# Patient Record
Sex: Female | Born: 1935 | Race: Black or African American | Hispanic: No | Marital: Married | Smoking: Never smoker
Health system: Southern US, Community
[De-identification: ages and names within clinical notes are randomized; demographics above are authoritative.]

## PROBLEM LIST (undated history)

## (undated) DIAGNOSIS — F0391 Unspecified dementia with behavioral disturbance: Secondary | ICD-10-CM

## (undated) DIAGNOSIS — F1020 Alcohol dependence, uncomplicated: Secondary | ICD-10-CM

## (undated) DIAGNOSIS — K219 Gastro-esophageal reflux disease without esophagitis: Secondary | ICD-10-CM

## (undated) DIAGNOSIS — I499 Cardiac arrhythmia, unspecified: Secondary | ICD-10-CM

## (undated) DIAGNOSIS — M1990 Unspecified osteoarthritis, unspecified site: Secondary | ICD-10-CM

## (undated) DIAGNOSIS — D649 Anemia, unspecified: Secondary | ICD-10-CM

## (undated) DIAGNOSIS — Z800 Family history of malignant neoplasm of digestive organs: Secondary | ICD-10-CM

## (undated) DIAGNOSIS — I10 Essential (primary) hypertension: Secondary | ICD-10-CM

## (undated) HISTORY — DX: Alcohol dependence, uncomplicated: F10.20

## (undated) HISTORY — DX: Unspecified dementia with behavioral disturbance: F03.91

## (undated) HISTORY — PX: CHOLECYSTECTOMY: SHX55

## (undated) HISTORY — DX: Cardiac arrhythmia, unspecified: I49.9

## (undated) HISTORY — DX: Gastro-esophageal reflux disease without esophagitis: K21.9

## (undated) HISTORY — DX: Family history of malignant neoplasm of digestive organs: Z80.0

## (undated) HISTORY — DX: Essential (primary) hypertension: I10

## (undated) HISTORY — PX: APPENDECTOMY: SHX54

## (undated) HISTORY — DX: Unspecified osteoarthritis, unspecified site: M19.90

## (undated) HISTORY — DX: Anemia, unspecified: D64.9

---

## 1999-02-14 ENCOUNTER — Ambulatory Visit (HOSPITAL_COMMUNITY): Admission: RE | Admit: 1999-02-14 | Discharge: 1999-02-14 | Payer: Self-pay | Admitting: Internal Medicine

## 2000-03-15 ENCOUNTER — Encounter: Admission: RE | Admit: 2000-03-15 | Discharge: 2000-03-15 | Payer: Self-pay | Admitting: Internal Medicine

## 2000-03-15 ENCOUNTER — Encounter: Payer: Self-pay | Admitting: Internal Medicine

## 2001-03-17 ENCOUNTER — Encounter: Admission: RE | Admit: 2001-03-17 | Discharge: 2001-03-17 | Payer: Self-pay | Admitting: Internal Medicine

## 2001-03-17 ENCOUNTER — Encounter: Payer: Self-pay | Admitting: Internal Medicine

## 2002-03-21 ENCOUNTER — Encounter: Payer: Self-pay | Admitting: Internal Medicine

## 2002-03-21 ENCOUNTER — Encounter: Admission: RE | Admit: 2002-03-21 | Discharge: 2002-03-21 | Payer: Self-pay | Admitting: Internal Medicine

## 2003-06-04 ENCOUNTER — Other Ambulatory Visit: Admission: RE | Admit: 2003-06-04 | Discharge: 2003-06-04 | Payer: Self-pay | Admitting: Internal Medicine

## 2003-08-06 ENCOUNTER — Encounter: Admission: RE | Admit: 2003-08-06 | Discharge: 2003-08-06 | Payer: Self-pay | Admitting: Internal Medicine

## 2003-08-10 ENCOUNTER — Encounter: Admission: RE | Admit: 2003-08-10 | Discharge: 2003-08-10 | Payer: Self-pay | Admitting: Internal Medicine

## 2004-05-06 ENCOUNTER — Encounter: Admission: RE | Admit: 2004-05-06 | Discharge: 2004-05-06 | Payer: Self-pay | Admitting: Internal Medicine

## 2005-02-04 IMAGING — CT CT ABDOMEN WO/W CM
1 of 2 series · 15 of 32 positions shown, 19 images · IV contrast (GASTROGRAFIN)
Comparison: none

CLINICAL DATA: Follow up left renal mass on ultrasound 08/06/03.  Con none.   Post cholecystectomy, hysterectomy, appendectomy, liver laceration repair 4134.  Hematuria. 
CT ABDOMEN W/O AND W/CONTRAST: 
Comparison is made with abdominal ultrasound 08/06/03.  Following oral contrast and pre and post IV administration 399cc of Omnipaque 300 multidetector spiral axial images were obtained through the abdomen.   In the region of suspicious 3cm focal bulging at the mid left kidney is CT evidence for anatomic variant so called dromedary hump.  No focal solid nor cystic mass is seen in this region by CT.  No abnormal calcifications are seen.  Tiny 2mm lower pole right renal cystic cortical foci are seen.  The bilateral kidneys are otherwise unremarkable.  Slight cardiomegaly with clear lung bases are noted.  Patient is post cholecystectomy with resultant slight prominence of the biliary tract.  No normal spleen is seen with slight areas of residual splenic tissue/splenosis.  Remaining abdominal organs appear normal without adenopathy nor inflammation.

[Series 3: — · axial · 0.78mm/px · z∈[-261,-26]mm · 15 of 119 slices shown, 19 images]
[im 6/119  soft-tissue]
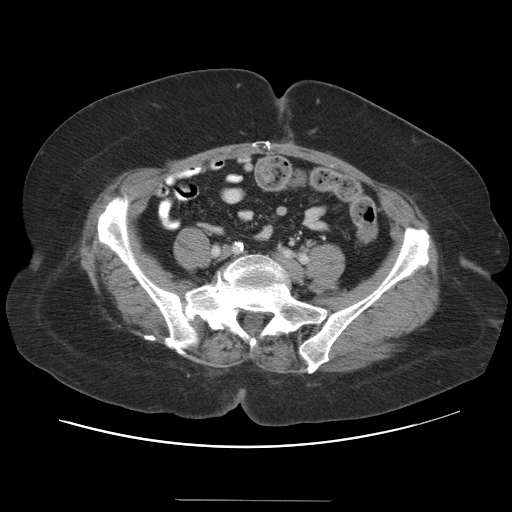
[im 6/119  bone]
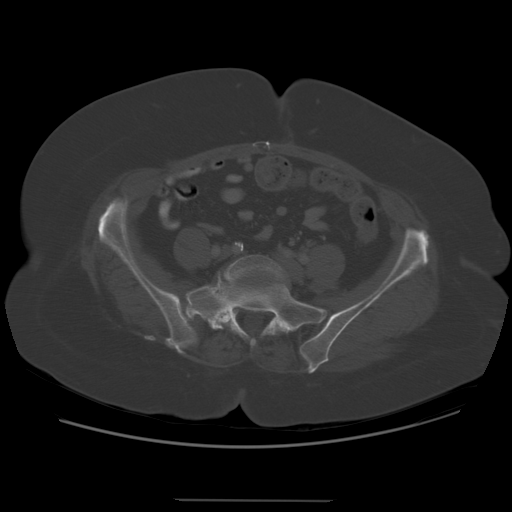
[im 17/119  soft-tissue]
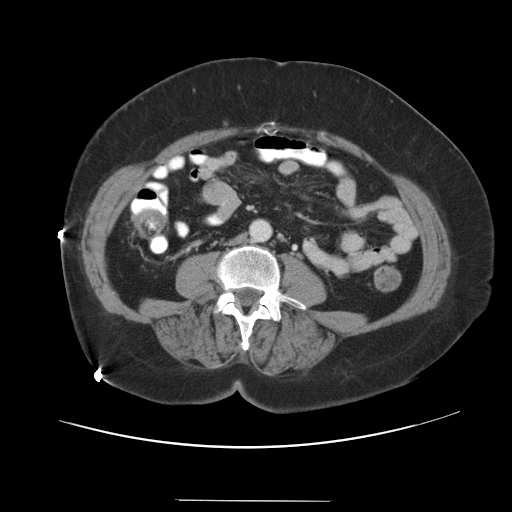
[im 27/119  soft-tissue]
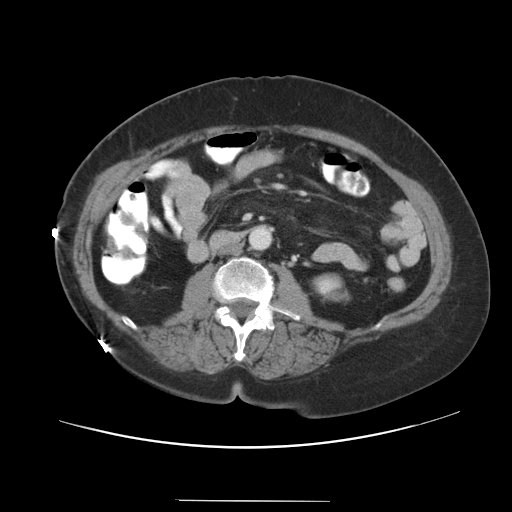
[im 33/119  soft-tissue]
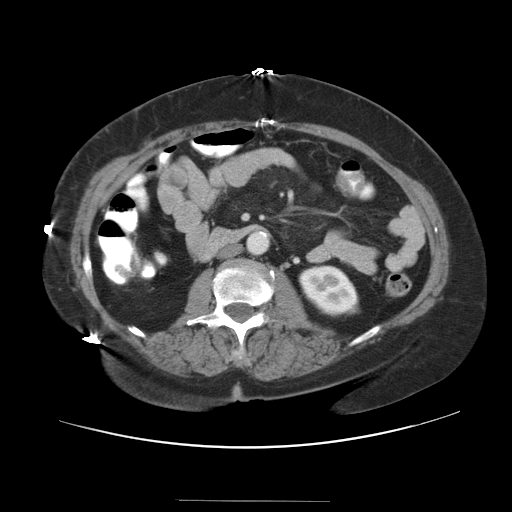
[im 43/119  soft-tissue]
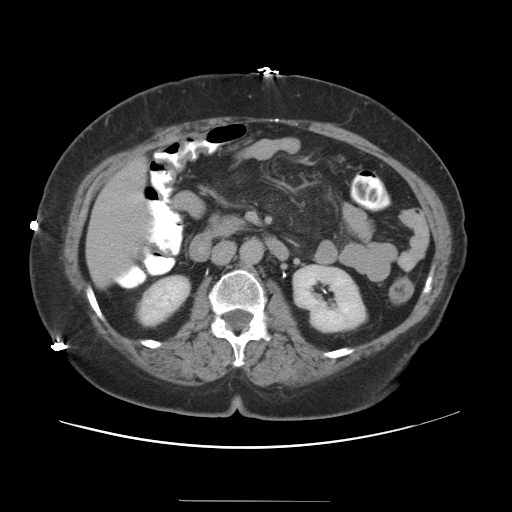
[im 49/119  soft-tissue]
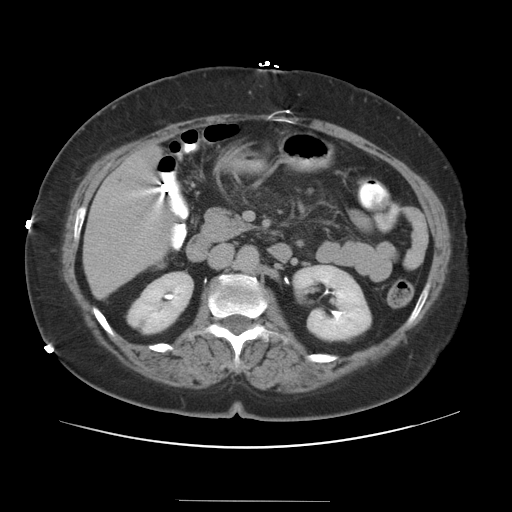
[im 60/119  soft-tissue]
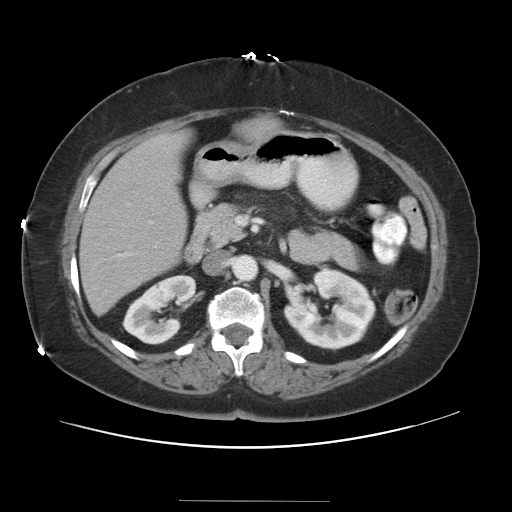
[im 70/119  soft-tissue]
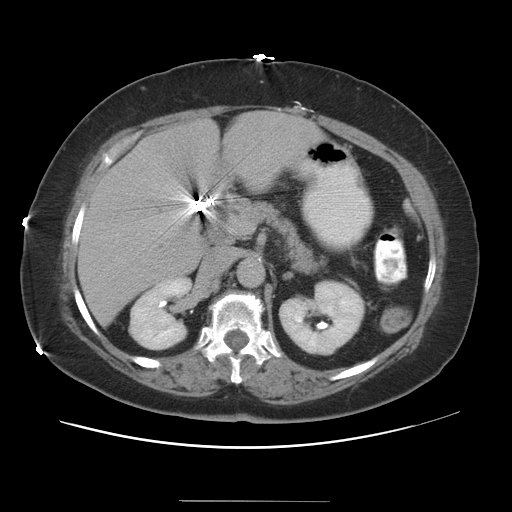
[im 76/119  soft-tissue]
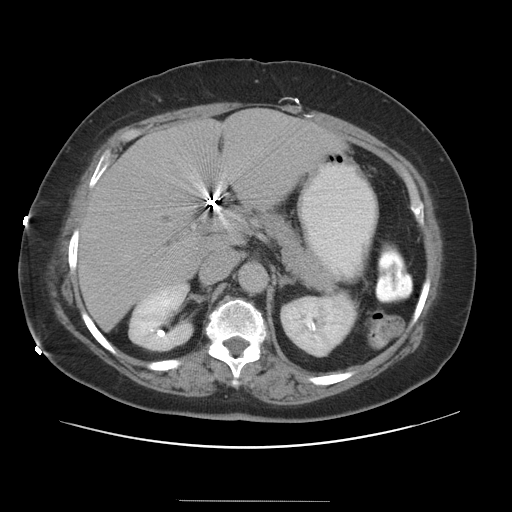
[im 76/119  bone]
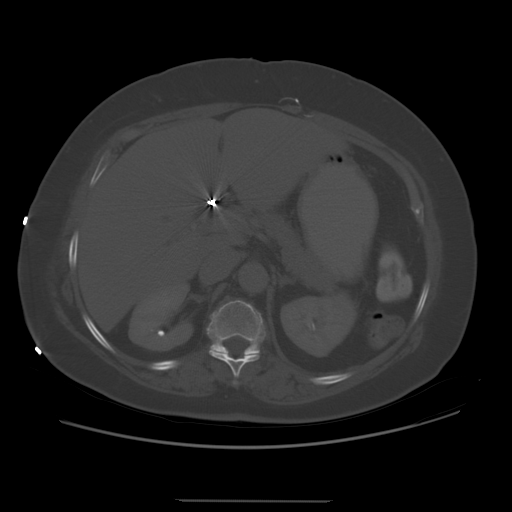
[im 86/119  soft-tissue]
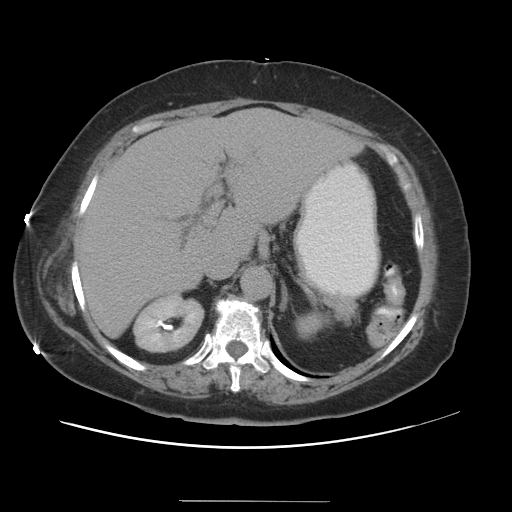
[im 92/119  soft-tissue]
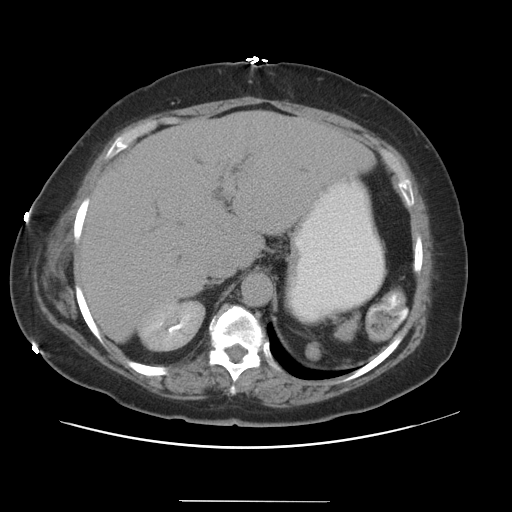
[im 97/119  lung]
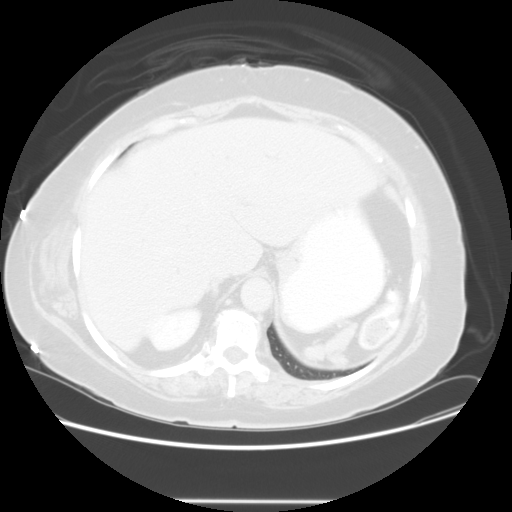
[im 102/119  soft-tissue]
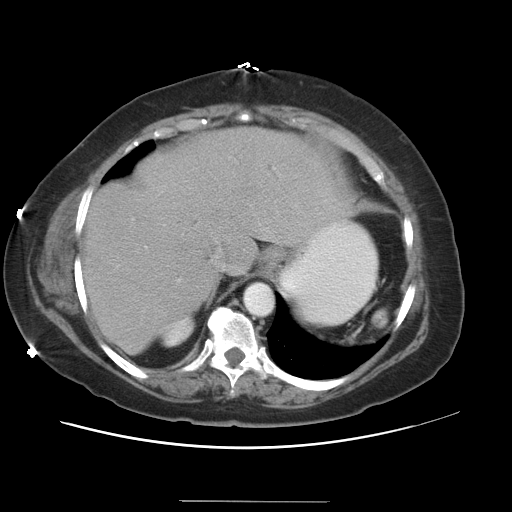
[im 102/119  lung]
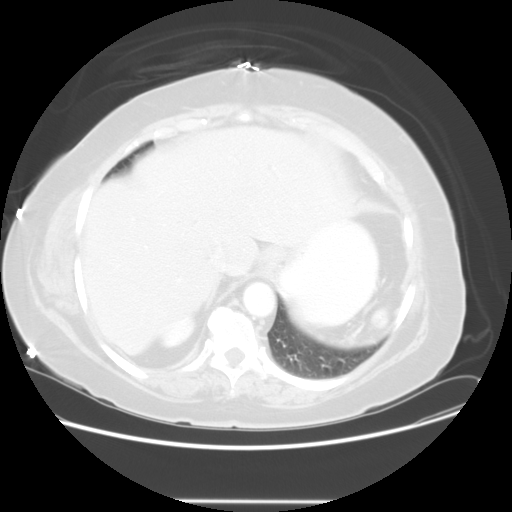
[im 108/119  lung]
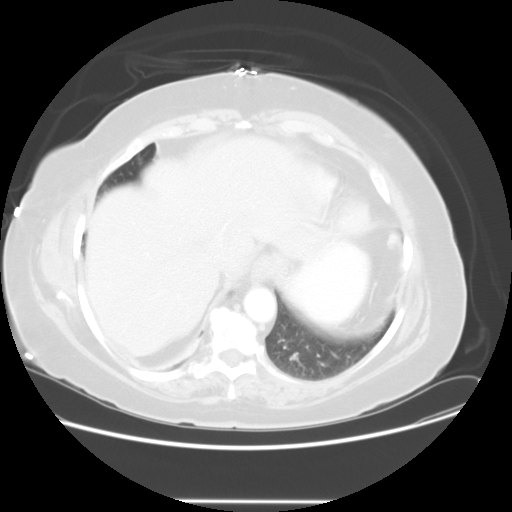
[im 113/119  soft-tissue]
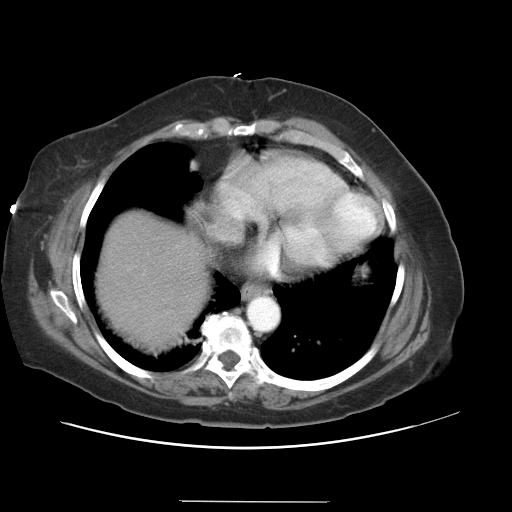
[im 113/119  lung]
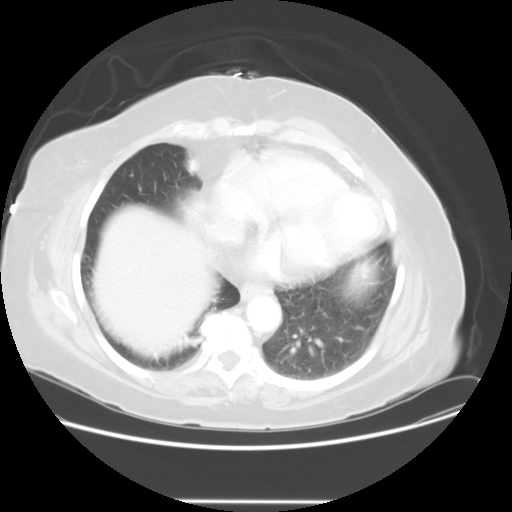

[15 of 32 positions shown; findings below may reference images not displayed]

IMPRESSION: Since abdominal ultrasound 08/06/03: 
1.  Ultrasound finding consistent with normal anatomic variant left mid renal dromedary hump/fetal lobulation. No renal mass seen.
2.  Slight splenosis (question prior splenectomy). 
3.  Post cholecystectomy with resultant slight prominence of the biliary tract. 
4.  Incidental tiny lower pole right renal cystic foci and cardiomegaly.
5.  Otherwise negative.

## 2006-03-19 ENCOUNTER — Encounter: Admission: RE | Admit: 2006-03-19 | Discharge: 2006-03-19 | Payer: Self-pay | Admitting: Internal Medicine

## 2008-06-05 ENCOUNTER — Encounter: Admission: RE | Admit: 2008-06-05 | Discharge: 2008-06-05 | Payer: Self-pay | Admitting: Internal Medicine

## 2010-06-18 ENCOUNTER — Other Ambulatory Visit: Payer: Self-pay | Admitting: Internal Medicine

## 2010-06-18 DIAGNOSIS — Z1231 Encounter for screening mammogram for malignant neoplasm of breast: Secondary | ICD-10-CM

## 2010-06-25 ENCOUNTER — Ambulatory Visit
Admission: RE | Admit: 2010-06-25 | Discharge: 2010-06-25 | Disposition: A | Discharge status: Home / Self Care | Payer: PRIVATE HEALTH INSURANCE | Source: Ambulatory Visit | Attending: Internal Medicine | Admitting: Internal Medicine

## 2010-06-25 DIAGNOSIS — Z1231 Encounter for screening mammogram for malignant neoplasm of breast: Secondary | ICD-10-CM

## 2011-09-04 ENCOUNTER — Other Ambulatory Visit: Payer: Self-pay | Admitting: Internal Medicine

## 2011-09-04 DIAGNOSIS — D649 Anemia, unspecified: Secondary | ICD-10-CM

## 2011-09-04 DIAGNOSIS — R634 Abnormal weight loss: Secondary | ICD-10-CM

## 2011-09-08 ENCOUNTER — Ambulatory Visit
Admission: RE | Admit: 2011-09-08 | Discharge: 2011-09-08 | Disposition: A | Discharge status: Home / Self Care | Payer: Medicare Other | Source: Ambulatory Visit | Attending: Internal Medicine | Admitting: Internal Medicine

## 2011-09-08 DIAGNOSIS — D649 Anemia, unspecified: Secondary | ICD-10-CM

## 2011-09-08 DIAGNOSIS — R634 Abnormal weight loss: Secondary | ICD-10-CM

## 2011-09-08 MED ORDER — IOHEXOL 300 MG/ML  SOLN
100.0000 mL | Freq: Once | INTRAMUSCULAR | Status: AC | PRN
  Administered 2011-09-08: 100 mL via INTRAVENOUS

## 2011-09-11 ENCOUNTER — Encounter: Payer: Self-pay | Admitting: Gastroenterology

## 2011-10-16 ENCOUNTER — Other Ambulatory Visit: Payer: Self-pay | Admitting: Internal Medicine

## 2011-10-16 ENCOUNTER — Ambulatory Visit
Admission: RE | Admit: 2011-10-16 | Discharge: 2011-10-16 | Disposition: A | Discharge status: Home / Self Care | Payer: Medicare Other | Source: Ambulatory Visit | Attending: Internal Medicine | Admitting: Internal Medicine

## 2011-10-16 DIAGNOSIS — R634 Abnormal weight loss: Secondary | ICD-10-CM

## 2011-10-21 ENCOUNTER — Encounter: Payer: Self-pay | Admitting: Gastroenterology

## 2011-10-21 ENCOUNTER — Other Ambulatory Visit (INDEPENDENT_AMBULATORY_CARE_PROVIDER_SITE_OTHER): Payer: Medicare Other

## 2011-10-21 ENCOUNTER — Ambulatory Visit (INDEPENDENT_AMBULATORY_CARE_PROVIDER_SITE_OTHER): Payer: Medicare Other | Admitting: Gastroenterology

## 2011-10-21 VITALS — BP 132/68 | HR 74 | Ht 62.0 in | Wt 161.0 lb

## 2011-10-21 DIAGNOSIS — R634 Abnormal weight loss: Secondary | ICD-10-CM

## 2011-10-21 DIAGNOSIS — D649 Anemia, unspecified: Secondary | ICD-10-CM

## 2011-10-21 LAB — COMPREHENSIVE METABOLIC PANEL
BUN: 23 mg/dL (ref 6–23)
CO2: 26 mEq/L (ref 19–32)
Calcium: 9.2 mg/dL (ref 8.4–10.5)
Chloride: 102 mEq/L (ref 96–112)
Creatinine, Ser: 1 mg/dL (ref 0.4–1.2)
GFR: 68.82 mL/min (ref >60.00)
Glucose, Bld: 92 mg/dL (ref 70–99)

## 2011-10-21 LAB — CBC WITH DIFFERENTIAL/PLATELET
Basophils Relative: 0.5 % (ref 0.0–3.0)
Eosinophils Absolute: 0.1 10*3/uL (ref 0.0–0.7)
Hemoglobin: 10.2 g/dL — ABNORMAL LOW (ref 12.0–15.0)
Lymphocytes Relative: 37.7 % (ref 12.0–46.0)
MCHC: 32.1 g/dL (ref 30.0–36.0)
Neutro Abs: 3.9 10*3/uL (ref 1.4–7.7)
RBC: 3.35 Mil/uL — ABNORMAL LOW (ref 3.87–5.11)

## 2011-10-21 LAB — T4: T4, Total: 7.6 ug/dL (ref 5.0–12.5)

## 2011-10-21 MED ORDER — PEG-KCL-NACL-NASULF-NA ASC-C 100 G PO SOLR: 1.0000 | Freq: Once | ORAL | Status: DC

## 2011-10-21 NOTE — Progress Notes (Signed)
History of Present Illness: [name] s a pleasant 78 year old Afro-American female referred at the request of Dr. Chilton Si for evaluation of weight loss. Over the past year she's lost about 30 pounds. She attributes this to poor appetite. She has no specific GI complaints his except for mild pyrosis for which she takes omeprazole with relief. There's been no change in her bowel habits, abdominal pain, melena or hematochezia. She has been noted to be anemic with equivocal iron studies. Last colonoscopy was in 10/18/02 which was normal. Mother had colon cancer at 64. There has been no change in her medications. She does complain of mild fatigue. There are no masses. Stool is Hemoccult negative.  Reason CT scan did not show any intra-abdominal abnormalities. Air was noted in the vagina.    Past Medical History  Diagnosis Date  . Hypertension   . Arrhythmia   . Family hx of colon cancer     mother  . GERD (gastroesophageal reflux disease)   . Alcoholism   . Anemia     Iron def  . Arthritis    Past Surgical History  Procedure Date  . Appendectomy   . Cholecystectomy    family history includes Colon cancer (age of onset:80) in her mother and Lung cancer in her father. Current Outpatient Prescriptions  Medication Sig Dispense Refill  . atenolol-chlorthalidone (TENORETIC) 50-25 MG per tablet Take 1 tablet by mouth daily.      Tery Sanfilippo Calcium (STOOL SOFTENER PO) Take by mouth as needed.      . Multiple Vitamin (MULTIVITAMIN) capsule Take 1 capsule by mouth daily.      Marland Kitchen omeprazole (PRILOSEC) 20 MG capsule Take 20 mg by mouth daily.      . traMADol-acetaminophen (ULTRACET) 37.5-325 MG per tablet Take 1 tablet by mouth every 6 (six) hours as needed.       Allergies as of 10/21/2011 - Review Complete 10/21/2011  Allergen Reaction Noted  . Codeine  10/20/2011  . Latex  10/20/2011    reports that she has never smoked. She has never used smokeless tobacco. She reports that she drinks alcohol. She  reports that she does not use illicit drugs.     Review of Systems: Pertinent positive and negative review of systems were noted in the above HPI section. All other review of systems were otherwise negative.  Vital signs were reviewed in today's medical record Physical Exam: General: Well developed , well nourished, no acute distress Head: Normocephalic and atraumatic Eyes:  sclerae anicteric, EOMI Ears: Normal auditory acuity Mouth: No deformity or lesions Neck: Supple, no masses or thyromegaly Lungs: Clear throughout to auscultation Heart: Regular rate and rhythm; no murmurs, rubs or bruits Abdomen: Soft, non tender and non distended. No masses, hepatosplenomegaly or hernias noted. Normal Bowel sounds Rectal:deferred Musculoskeletal: Symmetrical with no gross deformities  Skin: No lesions on visible extremities Pulses:  Normal pulses noted Extremities: No clubbing, cyanosis, edema or deformities noted Neurological: Alert oriented x 4, grossly nonfocal Cervical Nodes:  No significant cervical adenopathy Inguinal Nodes: No significant inguinal adenopathy Psychological:  Alert and cooperative. Normal mood and affect

## 2011-10-21 NOTE — Patient Instructions (Addendum)
Colonoscopy A colonoscopy is an exam to evaluate your entire colon. In this exam, your colon is cleansed. A long fiberoptic tube is inserted through your rectum and into your colon. The fiberoptic scope (endoscope) is a long bundle of enclosed and very flexible fibers. These fibers transmit light to the area examined and send images from that area to your caregiver. Discomfort is usually minimal. You may be given a drug to help you sleep (sedative) during or prior to the procedure. This exam helps to detect lumps (tumors), polyps, inflammation, and areas of bleeding. Your caregiver may also take a small piece of tissue (biopsy) that will be examined under a microscope. LET YOUR CAREGIVER KNOW ABOUT:   Allergies to food or medicine.   Medicines taken, including vitamins, herbs, eyedrops, over-the-counter medicines, and creams.   Use of steroids (by mouth or creams).   Previous problems with anesthetics or numbing medicines.   History of bleeding problems or blood clots.   Previous surgery.   Other health problems, including diabetes and kidney problems.   Possibility of pregnancy, if this applies.  BEFORE THE PROCEDURE   A clear liquid diet may be required for 2 days before the exam.   Ask your caregiver about changing or stopping your regular medications.   Liquid injections (enemas) or laxatives may be required.   A large amount of electrolyte solution may be given to you to drink over a short period of time. This solution is used to clean out your colon.   You should be present 60 minutes prior to your procedure or as directed by your caregiver.  AFTER THE PROCEDURE   If you received a sedative or pain relieving medication, you will need to arrange for someone to drive you home.   Occasionally, there is a little blood passed with the first bowel movement. Do not be concerned.  FINDING OUT THE RESULTS OF YOUR TEST Not all test results are available during your visit. If your test  results are not back during the visit, make an appointment with your caregiver to find out the results. Do not assume everything is normal if you have not heard from your caregiver or the medical facility. It is important for you to follow up on all of your test results. HOME CARE INSTRUCTIONS   It is not unusual to pass moderate amounts of gas and experience mild abdominal cramping following the procedure. This is due to air being used to inflate your colon during the exam. Walking or a warm pack on your belly (abdomen) may help.   You may resume all normal meals and activities after sedatives and medicines have worn off.   Only take over-the-counter or prescription medicines for pain, discomfort, or fever as directed by your caregiver. Do not use aspirin or blood thinners if a biopsy was taken. Consult your caregiver for medicine usage if biopsies were taken.  SEEK IMMEDIATE MEDICAL CARE IF:   You have a fever.   You pass large blood clots or fill a toilet with blood following the procedure. This may also occur 10 to 14 days following the procedure. This is more likely if a biopsy was taken.   You develop abdominal pain that keeps getting worse and cannot be relieved with medicine.  Document Released: 04/24/2000 Document Revised: 04/16/2011 Document Reviewed: 12/08/2007 [employer] Patient Information 2012 [employer]. 

## 2011-10-21 NOTE — Assessment & Plan Note (Addendum)
Etiology for anorexia and weight loss is not certain. There is no evidence for hyperthyroidism. Occult malignancy is a concern although there were no obvious abnormalities on the CT scan. Air was noted in the vagina which raises the question of a fistula. She also has a history of anemia although her blood counts are not available.  Recommendations #1 check CBC, TSH CMET #2 colonoscopy; if not diagnostic I will proceed with upper endoscopy

## 2011-10-23 ENCOUNTER — Encounter: Payer: Self-pay | Admitting: Gastroenterology

## 2011-10-23 ENCOUNTER — Ambulatory Visit (AMBULATORY_SURGERY_CENTER): Payer: Medicare Other | Admitting: Gastroenterology

## 2011-10-23 VITALS — BP 139/90 | HR 59 | Temp 98.4°F | Resp 17 | Ht 62.0 in | Wt 161.0 lb

## 2011-10-23 DIAGNOSIS — R634 Abnormal weight loss: Secondary | ICD-10-CM

## 2011-10-23 DIAGNOSIS — D649 Anemia, unspecified: Secondary | ICD-10-CM

## 2011-10-23 DIAGNOSIS — K5730 Diverticulosis of large intestine without perforation or abscess without bleeding: Secondary | ICD-10-CM

## 2011-10-23 DIAGNOSIS — D126 Benign neoplasm of colon, unspecified: Secondary | ICD-10-CM

## 2011-10-23 MED ORDER — SODIUM CHLORIDE 0.9 % IV SOLN: 500.0000 mL | INTRAVENOUS | Status: DC

## 2011-10-23 NOTE — Op Note (Signed)
[facility] [address] [place], [place]  [address]  COLONOSCOPY PROCEDURE REPORT  PATIENT:  Dana Delgado, Dana Delgado  MR#:  [id number] BIRTHDATE:  March 14, 1931, 80 yrs. old  GENDER:  female ENDOSCOPIST:  Barbette Hair. Arlyce Dice, MD REF. BY:  Nila Nephew, M.D. PROCEDURE DATE:  10/23/2011 PROCEDURE:  Colonoscopy with snare polypectomy, Colon with cold biopsy polypectomy ASA CLASS:  Class II INDICATIONS:  weight loss MEDICATIONS:   MAC sedation, administered by CRNA propofol 300mg IV  DESCRIPTION OF PROCEDURE:   After the risks benefits and alternatives of the procedure were thoroughly explained, informed consent was obtained.  Digital rectal exam was performed and revealed no abnormalities.   The LB CF-H180AL [id number] endoscope was introduced through the anus and advanced to the cecum, which was identified by both the appendix and ileocecal valve, without limitations.  The quality of the prep was excellent, using MoviPrep.  The instrument was then slowly withdrawn as the colon was fully examined. <<PROCEDUREIMAGES>>  FINDINGS:  A sessile polyp was found in the ascending colon. It was 10 mm in size. Polyps were snared without cautery. Retrieval was successful (see image8). snare polyp  There were multiple polyps identified and removed. in the ascending colon. 3 1-38mm sessile polyps - removed with cold biopsy forceps, submitted to pathology.  A sessile polyp was found in the cecum. It was 3 mm in size. Polyp was snared without cautery. Retrieval was successful (see image2). snare polyp  Mild diverticulosis was found in the sigmoid colon (see image10).  This was otherwise a normal examination of the colon (see image1 and image11).   Retroflexed views in the rectum revealed no abnormalities.    The time to cecum =  1) 2.75  minutes. The scope was then withdrawn in  1) 26.0  minutes from the cecum and the procedure completed. COMPLICATIONS:  None ENDOSCOPIC IMPRESSION: 1) 10 mm sessile  polyp in the ascending colon 2) Polyps, multiple in the ascending colon 3) 3 mm sessile polyp in the cecum 4) Mild diverticulosis in the sigmoid colon 5) Otherwise normal examination  Multiple benign appearing polyps.  Findings do not explain weight loss.  RECOMMENDATIONS: 1) Upper endoscopy will be scheduled  REPEAT EXAM:  No (age)  ______________________________ Barbette Hair. Arlyce Dice, MD  CC:  n. eSIGNED:   Barbette Hair. Nefertiti Mohamad at 10/23/2011 10:32 AM  [name], [id number]

## 2011-10-23 NOTE — Progress Notes (Signed)
Patient did not experience any of the following events: a burn prior to discharge; a fall within the facility; wrong site/side/patient/procedure/implant event; or a hospital transfer or hospital admission upon discharge from the facility. (G8907) Patient did not have preoperative order for IV antibiotic SSI prophylaxis. (G8918)  

## 2011-10-23 NOTE — Patient Instructions (Signed)
Mild diverticulosis, multiple polyps removed and sent to pathology.  YOU HAD AN ENDOSCOPIC PROCEDURE TODAY AT THE [facility]: Refer to the procedure report that was given to you for any specific questions about what was found during the examination.  If the procedure report does not answer your questions, please call your gastroenterologist to clarify.  If you requested that your care partner not be given the details of your procedure findings, then the procedure report has been included in a sealed envelope for you to review at your convenience later.  YOU SHOULD EXPECT: Some feelings of bloating in the abdomen. Passage of more gas than usual.  Walking can help get rid of the air that was put into your GI tract during the procedure and reduce the bloating. If you had a lower endoscopy (such as a colonoscopy or flexible sigmoidoscopy) you may notice spotting of blood in your stool or on the toilet paper. If you underwent a bowel prep for your procedure, then you may not have a normal bowel movement for a few days.  DIET: Your first meal following the procedure should be a light meal and then it is ok to progress to your normal diet.  A half-sandwich or bowl of soup is an example of a good first meal.  Heavy or fried foods are harder to digest and may make you feel nauseous or bloated.  Likewise meals heavy in dairy and vegetables can cause extra gas to form and this can also increase the bloating.  Drink plenty of fluids but you should avoid alcoholic beverages for 24 hours.  ACTIVITY: Your care partner should take you home directly after the procedure.  You should plan to take it easy, moving slowly for the rest of the day.  You can resume normal activity the day after the procedure however you should NOT DRIVE or use heavy machinery for 24 hours (because of the sedation medicines used during the test).    SYMPTOMS TO REPORT IMMEDIATELY: A gastroenterologist can be reached at any hour.   During normal business hours, 8:30 AM to 5:00 PM Monday through Friday, call [phone].  After hours and on weekends, please call the GI answering service at [phone] who will take a message and have the physician on call contact you.   Following lower endoscopy (colonoscopy or flexible sigmoidoscopy):  Excessive amounts of blood in the stool  Significant tenderness or worsening of abdominal pains  Swelling of the abdomen that is new, acute  Fever of 100F or higher  FOLLOW UP: If any biopsies were taken you will be contacted by phone or by letter within the next 1-3 weeks.  Call your gastroenterologist if you have not heard about the biopsies in 3 weeks.  Our staff will call the home number listed on your records the next business day following your procedure to check on you and address any questions or concerns that you may have at that time regarding the information given to you following your procedure. This is a courtesy call and so if there is no answer at the home number and we have not heard from you through the emergency physician on call, we will assume that you have returned to your regular daily activities without incident.  SIGNATURES/CONFIDENTIALITY: You and/or your care partner have signed paperwork which will be entered into your electronic medical record.  These signatures attest to the fact that that the information above on your After Visit Summary has been reviewed and  is understood.  Full responsibility of the confidentiality of this discharge information lies with you and/or your care-partner.

## 2011-10-26 ENCOUNTER — Telehealth: Payer: Self-pay

## 2011-10-26 ENCOUNTER — Ambulatory Visit (AMBULATORY_SURGERY_CENTER): Payer: Medicare Other | Admitting: *Deleted

## 2011-10-26 VITALS — Ht 62.0 in | Wt 159.8 lb

## 2011-10-26 DIAGNOSIS — D649 Anemia, unspecified: Secondary | ICD-10-CM

## 2011-10-26 DIAGNOSIS — R634 Abnormal weight loss: Secondary | ICD-10-CM

## 2011-10-26 NOTE — Telephone Encounter (Signed)
Unable to leave message line busy times two. 

## 2011-10-28 ENCOUNTER — Encounter: Payer: Self-pay | Admitting: Gastroenterology

## 2011-10-29 ENCOUNTER — Ambulatory Visit (AMBULATORY_SURGERY_CENTER): Payer: Medicare Other | Admitting: Gastroenterology

## 2011-10-29 ENCOUNTER — Encounter: Payer: Self-pay | Admitting: Gastroenterology

## 2011-10-29 VITALS — BP 147/63 | HR 63 | Temp 98.2°F | Resp 18 | Ht 62.0 in | Wt 159.0 lb

## 2011-10-29 DIAGNOSIS — D649 Anemia, unspecified: Secondary | ICD-10-CM

## 2011-10-29 DIAGNOSIS — K2970 Gastritis, unspecified, without bleeding: Secondary | ICD-10-CM

## 2011-10-29 DIAGNOSIS — R634 Abnormal weight loss: Secondary | ICD-10-CM

## 2011-10-29 DIAGNOSIS — D131 Benign neoplasm of stomach: Secondary | ICD-10-CM

## 2011-10-29 MED ORDER — SODIUM CHLORIDE 0.9 % IV SOLN: 500.0000 mL | INTRAVENOUS | Status: DC

## 2011-10-29 NOTE — Op Note (Signed)
[facility] [address] [place], [place]  [address]  ENDOSCOPY PROCEDURE REPORT  PATIENT:  Dana Delgado, Dana Delgado  MR#:  [id number] BIRTHDATE:  08-Apr-1931, 80 yrs. old  GENDER:  female  ENDOSCOPIST:  Barbette Hair. Arlyce Dice, MD Referred by:  Nila Nephew, M.D.  PROCEDURE DATE:  10/29/2011 PROCEDURE:  EGD, diagnostic 43235 ASA CLASS:  Class II INDICATIONS:  weight loss  MEDICATIONS:   MAC sedation, administered by CRNA propofol 80mg IV TOPICAL ANESTHETIC:  DESCRIPTION OF PROCEDURE:   After the risks and benefits of the procedure were explained, informed consent was obtained.  The [facility] GIF-H180 [id number] endoscope was introduced through the mouth and advanced to the third portion of the duodenum.  The instrument was slowly withdrawn as the mucosa was fully examined. <<PROCEDUREIMAGES>>  Mild gastritis was found. Diffuse, mild edema in fundus with multiple polyps. Bxs taken (see image6).  Otherwise the examination was normal (see image1, image2, image3, image5, and image7).    Retroflexed views revealed no abnormalities.    The scope was then withdrawn from the patient and the procedure completed.  COMPLICATIONS:  None  ENDOSCOPIC IMPRESSION: 1) Mild gastritis 2) Otherwise normal examination  No clear etiology for weight loss RECOMMENDATIONS: 1) Await biopsy results 2) Call office next 2-3 days to schedule an office appointment for 4 weeks  ______________________________ Barbette Hair. Arlyce Dice, MD  CC:  n. eSIGNED:   Barbette Hair. Jaymz Traywick at 10/29/2011 09:28 AM  [name], [id number]

## 2011-10-29 NOTE — Patient Instructions (Addendum)
Discharge instructions given with verbal understanding. Handout on gastritis given. Resume previous medications. YOU HAD AN ENDOSCOPIC PROCEDURE TODAY AT THE [facility]: Refer to the procedure report that was given to you for any specific questions about what was found during the examination.  If the procedure report does not answer your questions, please call your gastroenterologist to clarify.  If you requested that your care partner not be given the details of your procedure findings, then the procedure report has been included in a sealed envelope for you to review at your convenience later.  YOU SHOULD EXPECT: Some feelings of bloating in the abdomen. Passage of more gas than usual.  Walking can help get rid of the air that was put into your GI tract during the procedure and reduce the bloating. If you had a lower endoscopy (such as a colonoscopy or flexible sigmoidoscopy) you may notice spotting of blood in your stool or on the toilet paper. If you underwent a bowel prep for your procedure, then you may not have a normal bowel movement for a few days.  DIET: Your first meal following the procedure should be a light meal and then it is ok to progress to your normal diet.  A half-sandwich or bowl of soup is an example of a good first meal.  Heavy or fried foods are harder to digest and may make you feel nauseous or bloated.  Likewise meals heavy in dairy and vegetables can cause extra gas to form and this can also increase the bloating.  Drink plenty of fluids but you should avoid alcoholic beverages for 24 hours.  ACTIVITY: Your care partner should take you home directly after the procedure.  You should plan to take it easy, moving slowly for the rest of the day.  You can resume normal activity the day after the procedure however you should NOT DRIVE or use heavy machinery for 24 hours (because of the sedation medicines used during the test).    SYMPTOMS TO REPORT IMMEDIATELY: A  gastroenterologist can be reached at any hour.  During normal business hours, 8:30 AM to 5:00 PM Monday through Friday, call [phone].  After hours and on weekends, please call the GI answering service at [phone] who will take a message and have the physician on call contact you.   Following upper endoscopy (EGD)  Vomiting of blood or coffee ground material  New chest pain or pain under the shoulder blades  Painful or persistently difficult swallowing  New shortness of breath  Fever of 100F or higher  Black, tarry-looking stools  FOLLOW UP: If any biopsies were taken you will be contacted by phone or by letter within the next 1-3 weeks.  Call your gastroenterologist if you have not heard about the biopsies in 3 weeks.  Our staff will call the home number listed on your records the next business day following your procedure to check on you and address any questions or concerns that you may have at that time regarding the information given to you following your procedure. This is a courtesy call and so if there is no answer at the home number and we have not heard from you through the emergency physician on call, we will assume that you have returned to your regular daily activities without incident.  SIGNATURES/CONFIDENTIALITY: You and/or your care partner have signed paperwork which will be entered into your electronic medical record.  These signatures attest to the fact that that the information above on   your After Visit Summary has been reviewed and is understood.  Full responsibility of the confidentiality of this discharge information lies with you and/or your care-partner. 

## 2011-10-29 NOTE — Progress Notes (Signed)
Patient did not experience any of the following events: a burn prior to discharge; a fall within the facility; wrong site/side/patient/procedure/implant event; or a hospital transfer or hospital admission upon discharge from the facility. (G8907) Patient did not have preoperative order for IV antibiotic SSI prophylaxis. (G8918)  

## 2011-10-30 ENCOUNTER — Telehealth: Payer: Self-pay

## 2011-10-30 NOTE — Telephone Encounter (Signed)
  Follow up Call-  Call back number 10/29/2011 10/23/2011  Post procedure Call Back phone  # [phone]  Permission to leave phone message Yes Yes     Patient questions:  Do you have a fever, pain , or abdominal swelling? no Pain Score  0 *  Have you tolerated food without any problems? yes  Have you been able to return to your normal activities? yes  Do you have any questions about your discharge instructions: Diet   no Medications  no Follow up visit  no  Do you have questions or concerns about your Care? no  Actions: * If pain score is 4 or above: No action needed, pain <4.  Per the pt "I did okay". Maw  I reminded her she needs to call 3rd floor to set up an appointment. Maw

## 2011-11-05 ENCOUNTER — Encounter: Payer: Self-pay | Admitting: Gastroenterology

## 2011-11-06 ENCOUNTER — Encounter: Payer: Self-pay | Admitting: Gastroenterology

## 2011-11-06 ENCOUNTER — Ambulatory Visit (INDEPENDENT_AMBULATORY_CARE_PROVIDER_SITE_OTHER): Payer: Medicare Other | Admitting: Gastroenterology

## 2011-11-06 VITALS — BP 130/66 | HR 64 | Ht 61.0 in | Wt 154.8 lb

## 2011-11-06 DIAGNOSIS — Z860100 Personal history of colon polyps, unspecified: Secondary | ICD-10-CM | POA: Insufficient documentation

## 2011-11-06 DIAGNOSIS — R634 Abnormal weight loss: Secondary | ICD-10-CM

## 2011-11-06 NOTE — Patient Instructions (Addendum)
Follow up in 4-6 weeks

## 2011-11-06 NOTE — Progress Notes (Signed)
History of Present Illness:  [name] has returned for followup of weight loss. Upper endoscopy demonstrated fundic gland polyps. Small adenomatous polyps were removed at colonoscopy. CT scan was unrevealing as well as her lab work. She continues to complain of anorexia and has lost 4 more pounds since her last visit.  She takes Ultracet several times a day for leg pain    Review of Systems: Pertinent positive and negative review of systems were noted in the above HPI section. All other review of systems were otherwise negative.    Current Medications, Allergies, Past Medical History, Past Surgical History, Family History and Social History were reviewed in [employer] electronic medical record  Vital signs were reviewed in today's medical record. Physical Exam: Weight 154.8 General: Well developed , well nourished, no acute distress

## 2011-11-06 NOTE — Assessment & Plan Note (Signed)
Anorexia could be do to her medication (Ultracet). Workup including upper and lower endoscopy and CT scan, lab work all unremarkable.  Recommendations #1 hold Ultracet for one week

## 2011-11-06 NOTE — Assessment & Plan Note (Signed)
No followup routine colonoscopy because of the patient's age

## 2011-11-16 ENCOUNTER — Ambulatory Visit
Admission: RE | Admit: 2011-11-16 | Discharge: 2011-11-16 | Disposition: A | Discharge status: Home / Self Care | Payer: Medicare Other | Source: Ambulatory Visit | Attending: Internal Medicine | Admitting: Internal Medicine

## 2011-11-16 ENCOUNTER — Other Ambulatory Visit: Payer: Self-pay | Admitting: Internal Medicine

## 2011-11-16 DIAGNOSIS — Z1231 Encounter for screening mammogram for malignant neoplasm of breast: Secondary | ICD-10-CM

## 2011-12-14 ENCOUNTER — Ambulatory Visit: Payer: Medicare Other | Admitting: Gastroenterology

## 2015-01-08 ENCOUNTER — Encounter: Payer: Self-pay | Admitting: Gastroenterology

## 2015-12-08 ENCOUNTER — Emergency Department (HOSPITAL_COMMUNITY)
Admission: EM | Admit: 2015-12-08 | Discharge: 2015-12-08 | Disposition: A | Discharge status: Home / Self Care | Payer: Medicare Other | Attending: Emergency Medicine | Admitting: Emergency Medicine

## 2015-12-08 ENCOUNTER — Emergency Department (HOSPITAL_COMMUNITY): Payer: Medicare Other

## 2015-12-08 ENCOUNTER — Encounter (HOSPITAL_COMMUNITY): Payer: Self-pay

## 2015-12-08 DIAGNOSIS — R001 Bradycardia, unspecified: Secondary | ICD-10-CM | POA: Insufficient documentation

## 2015-12-08 DIAGNOSIS — I517 Cardiomegaly: Secondary | ICD-10-CM

## 2015-12-08 DIAGNOSIS — I10 Essential (primary) hypertension: Secondary | ICD-10-CM | POA: Diagnosis not present

## 2015-12-08 DIAGNOSIS — R7989 Other specified abnormal findings of blood chemistry: Secondary | ICD-10-CM | POA: Diagnosis not present

## 2015-12-08 DIAGNOSIS — R0602 Shortness of breath: Secondary | ICD-10-CM | POA: Insufficient documentation

## 2015-12-08 DIAGNOSIS — D649 Anemia, unspecified: Secondary | ICD-10-CM

## 2015-12-08 DIAGNOSIS — R450 Nervousness: Secondary | ICD-10-CM | POA: Diagnosis not present

## 2015-12-08 DIAGNOSIS — I4510 Unspecified right bundle-branch block: Secondary | ICD-10-CM

## 2015-12-08 DIAGNOSIS — D539 Nutritional anemia, unspecified: Secondary | ICD-10-CM | POA: Diagnosis not present

## 2015-12-08 LAB — CBC WITH DIFFERENTIAL/PLATELET
BASOS ABS: 0 10*3/uL (ref 0.0–0.1)
BASOS PCT: 0 %
EOS ABS: 0.1 10*3/uL (ref 0.0–0.7)
EOS PCT: 2 %
HCT: 30.2 % — ABNORMAL LOW (ref 36.0–46.0)
Hemoglobin: 9.4 g/dL — ABNORMAL LOW (ref 12.0–15.0)
LYMPHS PCT: 31 %
Lymphs Abs: 1.8 10*3/uL (ref 0.7–4.0)
MCH: 28.1 pg (ref 26.0–34.0)
MCHC: 31.1 g/dL (ref 30.0–36.0)
MCV: 90.4 fL (ref 78.0–100.0)
MONO ABS: 0.3 10*3/uL (ref 0.1–1.0)
Monocytes Relative: 5 %
Neutro Abs: 3.7 10*3/uL (ref 1.7–7.7)
Neutrophils Relative %: 62 %
PLATELETS: 305 10*3/uL (ref 150–400)
RBC: 3.34 MIL/uL — AB (ref 3.87–5.11)
RDW: 12.8 % (ref 11.5–15.5)
WBC: 6 10*3/uL (ref 4.0–10.5)

## 2015-12-08 LAB — BASIC METABOLIC PANEL
ANION GAP: 8 (ref 5–15)
BUN: 13 mg/dL (ref 6–20)
CALCIUM: 9.1 mg/dL (ref 8.9–10.3)
CO2: 25 mmol/L (ref 22–32)
Chloride: 103 mmol/L (ref 101–111)
Creatinine, Ser: 1 mg/dL (ref 0.44–1.00)
GFR, EST AFRICAN AMERICAN: 59 mL/min — AB (ref >60)
GFR, EST NON AFRICAN AMERICAN: 51 mL/min — AB (ref >60)
GLUCOSE: 97 mg/dL (ref 65–99)
POTASSIUM: 3.5 mmol/L (ref 3.5–5.1)
SODIUM: 136 mmol/L (ref 135–145)

## 2015-12-08 LAB — I-STAT TROPONIN, ED
TROPONIN I, POC: 0.02 ng/mL (ref 0.00–0.08)
TROPONIN I, POC: 0.02 ng/mL (ref 0.00–0.08)

## 2015-12-08 LAB — BRAIN NATRIURETIC PEPTIDE: B NATRIURETIC PEPTIDE 5: 244.3 pg/mL — AB (ref 0.0–100.0)

## 2015-12-08 NOTE — ED Provider Notes (Signed)
[facility] DEPT Provider Note   CSN: [id number] Arrival date & time: 12/08/15  [id number]  First Provider Contact:  None       History   Chief Complaint Chief Complaint  Patient presents with  . Shortness of Breath    HPI JAMES BURROW is a 82 y.o. female with a PMHx of alcoholism, arrhythmia, arthritis, GERD, HTN, and anemia, who presents to the ED accompanied by her husband who helps provide some of the history, with complaints of "having a spell of not feeling right and feeling nervous" that occurred around 7:30 this morning. Patient's husband states that she awoke him at 7:30 stating that she felt like she could not breathe, although the patient denies shortness of breath at this time or previously. Patient's husband reports that she has had a dry cough for 2-3 days. Reports that they went to her PCP on Tuesday 5 days ago and was prescribed Cymbalta 30 mg daily for anxiety and depression, she started this 2 days ago. She has not tried anything else for her symptoms. Currently she reports that aside from feeling nervous, she has no other complaints. She denies that there is anything specific causing her to feel anxious.  She denies any fevers, chills, chest pain, wheezing, leg swelling, orthopnea, claudication, recent travel/surgery/immobilization, family or personal history of DVT/PE, estrogen use, URI symptoms, lightheadedness, diaphoresis, abdominal pain, nausea, vomiting, diarrhea, constipation, melena, hematochezia, dysuria, hematuria, numbness, tingling, or focal weakness. She also denies any SI or HI.   The history is provided by the patient, medical records and the spouse. No language interpreter was used.  Shortness of Breath  This is a recurrent problem. The problem occurs rarely.The current episode started 1 to 2 hours ago. The problem has been resolved. Associated symptoms include cough. Pertinent negatives include no fever, no coryza, no rhinorrhea, no sore throat, no sputum  production, no wheezing, no PND, no orthopnea, no chest pain, no vomiting, no abdominal pain and no leg swelling. It is unknown what precipitated the problem. She has tried nothing for the symptoms. The treatment provided no relief. Associated medical issues do not include asthma, COPD, DVT or recent surgery.    Past Medical History:  Diagnosis Date  . Alcoholism ([place])   . Anemia    Iron def  . Arrhythmia   . Arthritis   . Family hx of colon cancer    mother  . GERD (gastroesophageal reflux disease)   . Hypertension     Patient Active Problem List   Diagnosis Date Noted  . Personal history of colonic polyps 11/06/2011  . Abnormal weight loss 10/21/2011    Past Surgical History:  Procedure Laterality Date  . APPENDECTOMY    . CHOLECYSTECTOMY      OB History    No data available       Home Medications    Prior to Admission medications   Medication Sig Start Date End Date Taking? Authorizing Provider  atenolol-chlorthalidone (TENORETIC) 50-25 MG per tablet Take 1 tablet by mouth daily.    Historical Provider, MD  Docusate Calcium (STOOL SOFTENER PO) Take by mouth as needed.    Historical Provider, MD  Multiple Vitamin (MULTIVITAMIN) capsule Take 1 capsule by mouth daily.    Historical Provider, MD  omeprazole (PRILOSEC) 20 MG capsule Take 20 mg by mouth daily.    Historical Provider, MD  traMADol-acetaminophen (ULTRACET) 37.5-325 MG per tablet Take 1 tablet by mouth every 6 (six) hours as needed.    Historical Provider,  MD    Family History Family History  Problem Relation Age of Onset  . Colon cancer Mother 56  . Lung cancer Father     Social History Social History  Substance Use Topics  . Smoking status: Never Smoker  . Smokeless tobacco: Never Used  . Alcohol use Yes     Comment: Drinks on weekends      Allergies   Codeine and Latex   Review of Systems Review of Systems  Constitutional: Negative for chills, diaphoresis and fever.  HENT: Negative  for rhinorrhea and sore throat.   Respiratory: Positive for cough and shortness of breath. Negative for sputum production and wheezing.   Cardiovascular: Negative for chest pain, orthopnea, leg swelling and PND.  Gastrointestinal: Negative for abdominal pain, blood in stool, constipation, diarrhea, nausea and vomiting.  Genitourinary: Negative for dysuria and hematuria.  Musculoskeletal: Negative for arthralgias and myalgias.  Skin: Negative for color change.  Allergic/Immunologic: Negative for immunocompromised state.  Neurological: Negative for weakness, light-headedness and numbness.  Psychiatric/Behavioral: Negative for confusion and suicidal ideas. The patient is nervous/anxious.    10 Systems reviewed and are negative for acute change except as noted in the HPI.   Physical Exam Updated Vital Signs BP 134/78 (BP Location: Right Arm)   Pulse (!) 50   Temp 98.8 F (37.1 C) (Oral)   Resp 18   Ht 5\' 3"  (1.6 m)   Wt 65.8 kg   SpO2 100%   BMI 25.69 kg/m   Physical Exam  Constitutional: She is oriented to person, place, and time. Vital signs are normal. She appears well-developed and well-nourished.  Non-toxic appearance. No distress.  Afebrile, nontoxic, NAD  HENT:  Head: Normocephalic and atraumatic.  Mouth/Throat: Oropharynx is clear and moist and mucous membranes are normal.  Eyes: Conjunctivae and EOM are normal. Right eye exhibits no discharge. Left eye exhibits no discharge.  Neck: Normal range of motion. Neck supple.  Cardiovascular: Regular rhythm, normal heart sounds and intact distal pulses.  Bradycardia present.  Exam reveals no gallop and no friction rub.   No murmur heard. Mildly bradycardic in the 50s, reg rhythm. Nl s1/s2, no m/r/g, distal pulses intact, no pedal edema.  Pulmonary/Chest: Effort normal and breath sounds normal. No respiratory distress. She has no decreased breath sounds. She has no wheezes. She has no rhonchi. She has no rales.  CTAB in all lung  fields, no w/r/r, no hypoxia or increased WOB, speaking in full sentences, SpO2 100% on RA  Abdominal: Soft. Normal appearance and bowel sounds are normal. She exhibits no distension. There is no tenderness. There is no rigidity, no rebound, no guarding, no CVA tenderness, no tenderness at McBurney's point and negative Murphy's sign.  Musculoskeletal: Normal range of motion.  MAE x4 Strength and sensation grossly intact Distal pulses intact Gait steady No pedal edema, neg homan's bilaterally   Neurological: She is alert and oriented to person, place, and time. She has normal strength. No sensory deficit.  Skin: Skin is warm, dry and intact. No rash noted.  Psychiatric: She has a normal mood and affect.  Nursing note and vitals reviewed.    ED Treatments / Results  Labs (all labs ordered are listed, but only abnormal results are displayed) Labs Reviewed  CBC WITH DIFFERENTIAL/PLATELET - Abnormal; Notable for the following:       Result Value   RBC 3.34 (*)    Hemoglobin 9.4 (*)    HCT 30.2 (*)    All other components within  normal limits  BASIC METABOLIC PANEL - Abnormal; Notable for the following:    GFR calc non Af Amer 51 (*)    GFR calc Af Amer 59 (*)    All other components within normal limits  BRAIN NATRIURETIC PEPTIDE - Abnormal; Notable for the following:    B Natriuretic Peptide 244.3 (*)    All other components within normal limits  I-STAT TROPOININ, ED  I-STAT TROPOININ, ED    EKG  EKG Interpretation  Date/Time:  Sunday December 08 2015 08:22:46 EDT Ventricular Rate:  54 PR Interval:  192 QRS Duration: 110 QT Interval:  458 QTC Calculation: 434 R Axis:   -46 Text Interpretation:  Sinus bradycardia Incomplete right bundle branch block Left anterior fascicular block Abnormal ECG No previous ECGs available Confirmed by Wyvonnia Dusky  MD, STEPHEN [phone]) on 12/08/2015 9:24:42 AM       Radiology Dg Chest 2 View  Result Date: 12/08/2015 CLINICAL DATA:  Acute shortness  of breath today. EXAM: CHEST  2 VIEW COMPARISON:  10/16/2011 radiograph FINDINGS: Cardiomegaly noted. There is no evidence of focal airspace disease, pulmonary edema, suspicious pulmonary nodule/mass, pleural effusion, or pneumothorax. No acute bony abnormalities are identified. IMPRESSION: Cardiomegaly without evidence of active cardiopulmonary disease. Electronically Signed   By: Margarette Canada M.D.   On: 12/08/2015 09:08   Procedures Procedures (including critical care time)  Medications Ordered in ED Medications - No data to display   Initial Impression / Assessment and Plan / ED Course  I have reviewed the triage vital signs and the nursing notes.  Pertinent labs & imaging results that were available during my care of the patient were reviewed by me and considered in my medical decision making (see chart for details).  Clinical Course    82 y.o. female here with complaints of nervousness. Patient's husband states that she woke and 7:30 telling him she couldn't breathe, but she denies shortness of breath. Husband states that she has had a cough for 3 days which is dry and nonproductive. On exam, mild bradycardia heart rate in the 50s, clear lung exam, no pedal edema, no wheezing or rales, no tachypnea or tachycardia, no hypoxia. PCP started her on Cymbalta for depression and anxiety, this could be the cause of her symptoms. Very difficult to fully assess what her concern is that she just tells me that she is nervous but cannot tell me why or if there are other associated symptoms. CXR showing cardiomegaly without acute disease. Awaiting labs. EKG with incomplete RBBB but without acute ischemic findings. Will await labs. Will hold off on meds considering it's unclear the etiology or even the exact nature of her complaints. Will reassess shortly.   10:09 AM BMP WNL. Trop neg. CBC showing mild anemia, similar to values from 2013, prior GI notes report there was nothing to do about her anemia from  their standpoint due to her age. Overall stable anemia, doubt need for further intervention at this time. Given that she told her husband she had SOB earlier, and pt's complaints are slightly vague, will keep monitoring and get BNP now and delta trop at 11:45am, and then likely d/c home with instructions to continue cymbalta and close PCP f/up. Discussed case with my attending Dr. Wyvonnia Dusky who saw pt and who agrees with plan. Will reassess shortly.   1:06 PM  BNP 244.3, no baseline to compare, could represent CHF but pt has never had echo. Does not appear to be in acute CHF exacerbation, doubt need for  emergent work up or ongoing evaluation, can f/up with PCP for outpt echo and management. Second trop neg. Ambulatory without desats. Overall stable, does not have ongoing SOB, symptoms likely from her anxiety, continue cymbalta as directed by her PCP, f/up with PCP in 3 days for ongoing management of her symptoms and for outpatient work up for possible CHF. I explained the diagnosis and have given explicit precautions to return to the ER including for any other new or worsening symptoms. The patient understands and accepts the medical plan as it's been dictated and I have answered their questions. Discharge instructions concerning home care and prescriptions have been given. The patient is STABLE and is discharged to home in good condition.   Final Clinical Impressions(s) / ED Diagnoses   Final diagnoses:  Nervousness  SOB (shortness of breath)  Chronic anemia  Bradycardia  Incomplete RBBB  Elevated brain natriuretic peptide (BNP) level  Cardiomegaly    New Prescriptions New Prescriptions   No medications on file     Zacarias Pontes, PA-C 12/08/15 [place], MD 12/08/15 1746

## 2015-12-08 NOTE — ED Triage Notes (Signed)
Patient here with increased shortness of breath and dry cough for 1 week. States that she has been to MD and no diagnosis made. Denies CP. Alert on arrival.

## 2015-12-08 NOTE — ED Notes (Signed)
Merecedes, PA at the bedside at this time

## 2015-12-08 NOTE — ED Notes (Signed)
Pt to be taken to Xray from Waiting Room

## 2015-12-08 NOTE — Discharge Instructions (Signed)
Your work up today was reassuring. Your symptoms could be due to your anxiety, continue taking cymbalta as directed by your regular doctor. You will need to follow up with your regular doctor to have ongoing evaluation of your shortness of breath to see if you have congestive heart failure, they may need to consider getting an echocardiogram (ultrasound of your heart) as an outpatient. Eat a low-salt diet, take home medications as directed, stay hydrated, and follow up with your doctor in 2-3 days for recheck of symptoms. Return to the ER for changes or worsening symptoms.

## 2015-12-08 NOTE — ED Notes (Signed)
Pt. Ambulated in hallway with cane and one assist. Pt. Oxygen saturation maintained at 100% and no SOB complaints from pt.

## 2015-12-08 NOTE — ED Notes (Signed)
Phlebotomy at the bedside  

## 2015-12-08 NOTE — ED Notes (Signed)
Pt. Taken to restroom in wheelchair at this time.

## 2016-04-15 ENCOUNTER — Ambulatory Visit (INDEPENDENT_AMBULATORY_CARE_PROVIDER_SITE_OTHER): Payer: Medicare Other | Admitting: Neurology

## 2016-04-15 ENCOUNTER — Encounter: Payer: Self-pay | Admitting: Neurology

## 2016-04-15 VITALS — BP 152/99 | HR 115 | Ht 63.0 in | Wt 145.0 lb

## 2016-04-15 DIAGNOSIS — F03918 Unspecified dementia, unspecified severity, with other behavioral disturbance: Secondary | ICD-10-CM

## 2016-04-15 DIAGNOSIS — F0391 Unspecified dementia with behavioral disturbance: Secondary | ICD-10-CM | POA: Diagnosis not present

## 2016-04-15 DIAGNOSIS — E538 Deficiency of other specified B group vitamins: Secondary | ICD-10-CM | POA: Diagnosis not present

## 2016-04-15 HISTORY — DX: Unspecified dementia with behavioral disturbance: F03.91

## 2016-04-15 HISTORY — DX: Unspecified dementia, unspecified severity, with other behavioral disturbance: F03.918

## 2016-04-15 MED ORDER — RISPERIDONE 0.5 MG PO TABS: 0.5000 mg | ORAL_TABLET | Freq: Every day | ORAL | 2 refills | Status: AC

## 2016-04-15 NOTE — Patient Instructions (Signed)
   We will check CT of the head and get blood work today. We will start risperidol at night for agitation and confusion. Call for dose adjustments.

## 2016-04-15 NOTE — Progress Notes (Signed)
Reason for visit:  Memory disturbance  Referring physician:  Dr. Fabian Sharp is a 82 y.o. female  History of present illness:   Dana Delgado is an 82 year old right-handed black female with a progressive memory disorder. The patient comes in with her husband today. The patient has had some increasing problems with agitation, she spends most of the day by herself while her husband goes to work. The patient will see people about the house and outside of the house at nighttime, she is agitated with this, she is up and down all night long. The patient and her husband argue frequently about various issues, the patient will accuse her husband of sleeping with other women. The patient no longer operates motor vehicle, she no longer does the finances, she does not cook. She needs help keeping up with medications and appointments. The patient denies any weakness or numbness of the extremities, she may have some dizziness at times. She denies any fatigue. She does have some problems with depression. She has some gait instability, she walks with a cane. She has recently been placed on Cymbalta in low dose. She is sent to this office for an evaluation.  Past Medical History:  Diagnosis Date  . Alcoholism ([place])   . Anemia    Iron def  . Arrhythmia   . Arthritis   . Family hx of colon cancer    mother  . GERD (gastroesophageal reflux disease)   . Hypertension     Past Surgical History:  Procedure Laterality Date  . APPENDECTOMY    . CHOLECYSTECTOMY      Family History  Problem Relation Age of Onset  . Colon cancer Mother 59  . Lung cancer Father     Social history:  reports that she has never smoked. She has never used smokeless tobacco. She reports that she drinks alcohol. She reports that she does not use drugs.  Medications:  Prior to Admission medications   Medication Sig Start Date End Date Taking? Authorizing Provider  atenolol-chlorthalidone (TENORETIC) 50-25 MG per  tablet Take 1 tablet by mouth daily.   Yes Historical Provider, MD  DULoxetine (CYMBALTA) 30 MG capsule Take 30 mg by mouth daily.   Yes Historical Provider, MD  traMADol-acetaminophen (ULTRACET) 37.5-325 MG per tablet Take 1 tablet by mouth every 6 (six) hours as needed for severe pain.    Yes Historical Provider, MD      Allergies  Allergen Reactions  . Codeine Other (See Comments)    "NERVOUS"  . Latex Rash    ROS:  Out of a complete 14 system review of symptoms, the patient complains only of the following symptoms, and all other reviewed systems are negative.   Weight loss, fatigue  Dizziness  Birthmarks  Blurred vision  Joint pain  Memory loss, confusion, dizziness  Depression, anxiety, not enough sleep, decreased energy , change in appetite, suicidal ideation, hallucinations, racing thoughts  Snoring, restless legs  Blood pressure (!) 152/99, pulse (!) 115, height 5\' 3"  (1.6 m), weight 145 lb (65.8 kg).  Physical Exam  General: The patient is alert and cooperative at the time of the examination.  Eyes: Pupils are equal, round, and reactive to light. Discs are flat bilaterally.  Neck: The neck is supple, no carotid bruits are noted.  Respiratory: The respiratory examination is clear.  Cardiovascular: The cardiovascular examination reveals a regular rate and rhythm, no obvious murmurs or rubs are noted.  Skin: Extremities are without significant edema.  Neurologic Exam  Mental status: The patient is alert and oriented x 2 at the time of the examination. The patient would not cooperate for Mini-Mental Status Examination testing.  Cranial nerves: Facial symmetry is present. There is good sensation of the face to pinprick and soft touch bilaterally. The strength of the facial muscles and the muscles to head turning and shoulder shrug are normal bilaterally. Speech is well enunciated, no aphasia or dysarthria is noted. Extraocular movements are full. Visual fields are  full. The tongue is midline, and the patient has symmetric elevation of the soft palate. No obvious hearing deficits are noted.  Motor: The motor testing reveals 5 over 5 strength of all 4 extremities. Good symmetric motor tone is noted throughout.  Sensory: Sensory testing is intact to pinprick, soft touch, vibration sensation, and position sense on all 4 extremities. No evidence of extinction is noted.  Coordination: Cerebellar testing reveals good finger-nose-finger and heel-to-shin bilaterally.  Gait and station: Gait is wide-based, unsteady. Tandem gait was not attempted. Romberg is negative. No drift is seen.  Reflexes: Deep tendon reflexes are symmetric, but are depressed bilaterally. Toes are downgoing bilaterally.   Assessment/Plan:   1. Progressive memory disturbance , behavior disturbance   The patient appears to have some component of sundowning, she gets much more agitated and she will hallucinate at nighttime. The patient has not tried to wander outside the house. She will not cooperate for Mini-Mental Status Examination testing today. The patient is arguing throughout the examination with her husband. The patient will be set up for blood work today, she will undergo a CT scan of the brain. The patient will be placed on low-dose Risperdal in the evening hours, the family is to contact me for dose adjustments. Once the behavior has come under better control, we may add other medication such as Namenda or Aricept in the future.  Dana Alexanders MD 04/15/2016 4:05 PM  [facility] [address] [place], [place] [address]  Phone [phone] Fax [phone]

## 2016-04-17 ENCOUNTER — Telehealth: Payer: Self-pay

## 2016-04-17 LAB — VITAMIN B12: VITAMIN B 12: 440 pg/mL (ref 232–1245)

## 2016-04-17 LAB — COPPER, SERUM: COPPER: 153 ug/dL (ref 72–166)

## 2016-04-17 LAB — RPR: RPR: NONREACTIVE

## 2016-04-17 NOTE — Telephone Encounter (Signed)
Called w/ unremarkable lab results. (CT scan has been scheduled for next week.) Spoke to pt's sister-in-law, Judson Roch (on [place]). Reports that pt will not eat anything at all. Is also refusing her medications so has not yet taken Risperdal. Says that pt is still having hallucinations of people in the house, wandering around the house at night and talking about wanting to die. Judson Roch said that she is drinking Ensure. Suggested that family crush Risperdal tabs and mix in Ensure for pt. However, she says that pt is very paranoid and will only drink Ensure that she has opened herself. Instructed family to take pt to ER over the weekend if she becomes physically violent or suicidal. Verbalized understanding and appreciation for call.

## 2016-04-17 NOTE — Telephone Encounter (Signed)
-----   Message from Kathrynn Ducking, MD sent at 04/17/2016  7:20 AM EST -----   The blood work results are unremarkable. Please call the patient.  ----- Message ----- From: Lavone Neri Lab Results In Sent: 04/16/2016   7:43 AM To: Kathrynn Ducking, MD

## 2016-04-22 ENCOUNTER — Other Ambulatory Visit: Payer: PRIVATE HEALTH INSURANCE

## 2016-04-23 ENCOUNTER — Other Ambulatory Visit: Payer: PRIVATE HEALTH INSURANCE

## 2016-04-28 ENCOUNTER — Other Ambulatory Visit: Payer: PRIVATE HEALTH INSURANCE

## 2016-05-01 ENCOUNTER — Emergency Department (HOSPITAL_COMMUNITY): Payer: Medicare Other

## 2016-05-01 ENCOUNTER — Encounter (HOSPITAL_COMMUNITY): Payer: Self-pay | Admitting: *Deleted

## 2016-05-01 ENCOUNTER — Emergency Department (HOSPITAL_COMMUNITY)
Admission: EM | Admit: 2016-05-01 | Discharge: 2016-05-01 | Disposition: A | Discharge status: Home / Self Care | Payer: Medicare Other | Attending: Emergency Medicine | Admitting: Emergency Medicine

## 2016-05-01 DIAGNOSIS — F0280 Dementia in other diseases classified elsewhere without behavioral disturbance: Secondary | ICD-10-CM | POA: Diagnosis present

## 2016-05-01 DIAGNOSIS — Z91040 Latex allergy status: Secondary | ICD-10-CM | POA: Insufficient documentation

## 2016-05-01 DIAGNOSIS — Y939 Activity, unspecified: Secondary | ICD-10-CM | POA: Diagnosis not present

## 2016-05-01 DIAGNOSIS — I10 Essential (primary) hypertension: Secondary | ICD-10-CM | POA: Insufficient documentation

## 2016-05-01 DIAGNOSIS — Y92000 Kitchen of unspecified non-institutional (private) residence as  the place of occurrence of the external cause: Secondary | ICD-10-CM | POA: Diagnosis not present

## 2016-05-01 DIAGNOSIS — W19XXXA Unspecified fall, initial encounter: Secondary | ICD-10-CM | POA: Insufficient documentation

## 2016-05-01 DIAGNOSIS — Z79899 Other long term (current) drug therapy: Secondary | ICD-10-CM | POA: Diagnosis not present

## 2016-05-01 DIAGNOSIS — G309 Alzheimer's disease, unspecified: Secondary | ICD-10-CM | POA: Diagnosis not present

## 2016-05-01 DIAGNOSIS — Y999 Unspecified external cause status: Secondary | ICD-10-CM | POA: Insufficient documentation

## 2016-05-01 NOTE — ED Notes (Signed)
Bed: [facility] Expected date:  Expected time:  Means of arrival:  Comments: No bed  

## 2016-05-01 NOTE — ED Notes (Signed)
Pt unable to give me an account of what happened & denies pain.  Pt is pleasant and not in obvious distress.

## 2016-05-01 NOTE — ED Provider Notes (Signed)
[facility] Del[facility] DEPT Provider Note   CSN: [id number] Arrival date & time: 05/01/16  1441     History   Chief Complaint Chief Complaint  Patient presents with  . Fall    HPI Dana Delgado is a 82 y.o. female.  The history is provided by the EMS personnel and a relative.  Fall  This is a new problem. The current episode started 1 to 2 hours ago. The problem occurs constantly. The problem has not changed since onset.Pertinent negatives include no chest pain and no abdominal pain. Nothing aggravates the symptoms. Nothing relieves the symptoms. She has tried nothing for the symptoms.    Past Medical History:  Diagnosis Date  . Alcoholism ([employer])   . Anemia    Iron def  . Arrhythmia   . Arthritis   . Dementia with behavioral disturbance 04/15/2016  . Family hx of colon cancer    mother  . GERD (gastroesophageal reflux disease)   . Hypertension     Patient Active Problem List   Diagnosis Date Noted  . Dementia with behavioral disturbance 04/15/2016  . Personal history of colonic polyps 11/06/2011  . Abnormal weight loss 10/21/2011    Past Surgical History:  Procedure Laterality Date  . APPENDECTOMY    . CHOLECYSTECTOMY      OB History    No data available       Home Medications    Prior to Admission medications   Medication Sig Start Date End Date Taking? Authorizing Provider  atenolol-chlorthalidone (TENORETIC) 50-25 MG per tablet Take 1 tablet by mouth daily.    Historical Provider, MD  DULoxetine (CYMBALTA) 30 MG capsule Take 30 mg by mouth daily.    Historical Provider, MD  risperiDONE (RISPERDAL) 0.5 MG tablet Take 1 tablet (0.5 mg total) by mouth at bedtime. 04/15/16   Kathrynn Ducking, MD  traMADol-acetaminophen (ULTRACET) 37.5-325 MG per tablet Take 1 tablet by mouth every 6 (six) hours as needed for severe pain.     Historical Provider, MD    Family History Family History  Problem Relation Age of Onset  . Colon cancer Mother 41  . Lung cancer  Father     Social History Social History  Substance Use Topics  . Smoking status: Never Smoker  . Smokeless tobacco: Never Used  . Alcohol use Yes     Comment: Drinks on weekends      Allergies   Codeine and Latex   Review of Systems Review of Systems  Cardiovascular: Negative for chest pain.  Gastrointestinal: Negative for abdominal pain.  All other systems reviewed and are negative.    Physical Exam Updated Vital Signs BP 132/74 (BP Location: Right Arm)   Pulse 73   Temp 97.8 F (36.6 C) (Oral)   Resp 18   SpO2 99%   Physical Exam  Constitutional: She appears well-developed and well-nourished. No distress.  HENT:  Head: Normocephalic and atraumatic.  Nose: Nose normal.  Eyes: Conjunctivae are normal.  Neck: Neck supple. No tracheal deviation present.  Cardiovascular: Normal rate, regular rhythm and normal heart sounds.   Pulmonary/Chest: Effort normal and breath sounds normal. No respiratory distress. She exhibits no tenderness.  Abdominal: Soft. She exhibits no distension. There is no tenderness.  Neurological: She is alert. She is disoriented (at baseline per family).  Skin: Skin is warm and dry. Capillary refill takes less than 2 seconds.  Psychiatric: She has a normal mood and affect.     ED Treatments / Results  Labs (  all labs ordered are listed, but only abnormal results are displayed) Labs Reviewed - No data to display  EKG  EKG Interpretation None       Radiology Dg Chest 2 View  Result Date: 05/01/2016 CLINICAL DATA:  [place] today at home in kitchen, shortness of breath, dementia, hypertension EXAM: CHEST  2 VIEW COMPARISON:  12/08/2015 FINDINGS: Enlargement of cardiac silhouette with LEFT ventricular prominence. Atherosclerotic calcification aorta. Mediastinal contours and pulmonary vascularity normal. Atelectasis RIGHT base. Lungs otherwise clear. No pleural effusion or pneumothorax. Scattered degenerative changes of the thoracic spine and  shoulders. IMPRESSION: Enlargement cardiac silhouette. Subsegmental atelectasis RIGHT base without acute infiltrate. Electronically Signed   By: Lavonia Dana M.D.   On: 05/01/2016 16:12   Dg Pelvis 1-2 Views  Result Date: 05/01/2016 CLINICAL DATA:  Fall today. EXAM: PELVIS - 1-2 VIEW COMPARISON:  09/08/2011 CT abdomen/pelvis. FINDINGS: Multiple surgical sutures overlie the mid pelvis. Healed deformities are again noted in the superior and inferior right pubic rami. No acute fracture. No pelvic diastasis. No suspicious focal osseous lesion. No evidence of hip dislocation on this single frontal view. Spondylosis in the visualized lower lumbar spine. IMPRESSION: No acute fracture. Chronic healed deformities of the right pubic rami. Electronically Signed   By: Ilona Sorrel M.D.   On: 05/01/2016 16:12   Ct Head Wo Contrast  Result Date: 05/01/2016 CLINICAL DATA:  Possible fall, Alzheimer's dementia EXAM: CT HEAD WITHOUT CONTRAST TECHNIQUE: Contiguous axial images were obtained from the base of the skull through the vertex without intravenous contrast. COMPARISON:  None. FINDINGS: Brain: No evidence of acute infarction, hemorrhage, hydrocephalus, extra-axial collection or mass lesion/mass effect. Subcortical white matter and periventricular small vessel ischemic changes. Vascular: Intracranial atherosclerosis. Skull: Normal. Negative for fracture or focal lesion. Sinuses/Orbits: The visualized paranasal sinuses are essentially clear. The mastoid air cells are unopacified. Other: Global cortical and central atrophy. Secondary ventricular prominence. IMPRESSION: No evidence of acute intracranial abnormality. Atrophy with small vessel ischemic changes. Electronically Signed   By: Julian Hy M.D.   On: 05/01/2016 18:46   Dg Knee Complete 4 Views Right  Result Date: 05/01/2016 CLINICAL DATA:  Fall in kitchen today. Right knee injury and pain. Initial encounter. EXAM: RIGHT KNEE - COMPLETE 4+ VIEW  COMPARISON:  None. FINDINGS: No evidence of fracture, dislocation, or joint effusion. No evidence of arthropathy or other focal bone abnormality. Soft tissues are unremarkable. Moderate to severe tricompartmental osteoarthritis is seen. Generalized osteopenia also noted. IMPRESSION: No acute findings.  Tricompartmental osteoarthritis. Electronically Signed   By: Earle Gell M.D.   On: 05/01/2016 16:12    Procedures Procedures (including critical care time)  Medications Ordered in ED Medications - No data to display   Initial Impression / Assessment and Plan / ED Course  I have reviewed the triage vital signs and the nursing notes.  Pertinent labs & imaging results that were available during my care of the patient were reviewed by me and considered in my medical decision making (see chart for details).  Clinical Course     82 y.o. female presents with being found on floor for brief period of time after being left at home alone. No signs of injury. Family requested transport to hospital and requesting missed CT head for dementia screening that patient refused to go to earlier in the week. All radiology today is negative for acute findings and Pt is well appearing. Plan to follow up with PCP as needed and return precautions discussed for worsening or new  concerning symptoms.   Final Clinical Impressions(s) / ED Diagnoses   Final diagnoses:  Fall  Fall, initial encounter    New Prescriptions Discharge Medication List as of 05/01/2016  4:37 PM       Leo Grosser, MD 05/02/16 [phone]

## 2016-05-01 NOTE — Progress Notes (Signed)
CSW spoke with patient regarding placement questions. Patients sister-in-law stated patient has become more aggressive and is unable to take care of herself. Patients sister-in-law stated she has refused to go to her doctor appointments and has stated "I just want to die". Patient was supposed to have a CT scan but did not go to doctors appointment. Patient sister-in-law requesting CT scan while in the hospital. CSW updated EDP and will provide sister-in-law with list of facilities.   [name], LCSWA Clinical Social Worker [phone]

## 2016-05-01 NOTE — ED Triage Notes (Signed)
EMS reports pt ? Fell at home, Unknown when or how long on floor, gen pain, ambulatory, pt has history of Alzheimer's. Pt and family poor historians with EMS.

## 2016-05-01 NOTE — ED Notes (Signed)
Bed: RESB Expected date: 05/01/16 Expected time: 2:36 PM Means of arrival:  Comments: Fall/ unknown how long on floor

## 2016-05-01 NOTE — ED Notes (Signed)
Dana Delgado (Sister in law): [phone]

## 2016-05-07 ENCOUNTER — Other Ambulatory Visit: Payer: PRIVATE HEALTH INSURANCE

## 2016-05-13 ENCOUNTER — Encounter (HOSPITAL_COMMUNITY): Payer: Self-pay | Admitting: Nurse Practitioner

## 2016-05-13 ENCOUNTER — Emergency Department (HOSPITAL_COMMUNITY)
Admission: EM | Admit: 2016-05-13 | Discharge: 2016-05-14 | Disposition: A | Discharge status: Home / Self Care | Payer: Medicare HMO | Attending: Emergency Medicine | Admitting: Emergency Medicine

## 2016-05-13 DIAGNOSIS — F0281 Dementia in other diseases classified elsewhere with behavioral disturbance: Secondary | ICD-10-CM | POA: Diagnosis not present

## 2016-05-13 DIAGNOSIS — I10 Essential (primary) hypertension: Secondary | ICD-10-CM | POA: Insufficient documentation

## 2016-05-13 DIAGNOSIS — Z644 Discord with counselors: Secondary | ICD-10-CM | POA: Diagnosis not present

## 2016-05-13 DIAGNOSIS — Z91040 Latex allergy status: Secondary | ICD-10-CM | POA: Diagnosis not present

## 2016-05-13 DIAGNOSIS — R2681 Unsteadiness on feet: Secondary | ICD-10-CM | POA: Diagnosis not present

## 2016-05-13 DIAGNOSIS — G309 Alzheimer's disease, unspecified: Secondary | ICD-10-CM | POA: Diagnosis not present

## 2016-05-13 DIAGNOSIS — Z658 Other specified problems related to psychosocial circumstances: Secondary | ICD-10-CM

## 2016-05-13 DIAGNOSIS — F919 Conduct disorder, unspecified: Secondary | ICD-10-CM | POA: Diagnosis present

## 2016-05-13 MED ORDER — ATENOLOL 50 MG PO TABS
50.0000 mg | ORAL_TABLET | Freq: Every day | ORAL | Status: DC
  Filled 2016-05-13 (×2): qty 1

## 2016-05-13 MED ORDER — DULOXETINE HCL 30 MG PO CPEP
30.0000 mg | ORAL_CAPSULE | Freq: Every day | ORAL | Status: DC
  Filled 2016-05-13: qty 1

## 2016-05-13 MED ORDER — RISPERIDONE 0.5 MG PO TABS
0.5000 mg | ORAL_TABLET | Freq: Every day | ORAL | Status: DC
  Administered 2016-05-14: 0.5 mg via ORAL
  Filled 2016-05-13: qty 1

## 2016-05-13 MED ORDER — CHLORTHALIDONE 25 MG PO TABS
25.0000 mg | ORAL_TABLET | Freq: Every day | ORAL | Status: DC
  Administered 2016-05-14: 25 mg via ORAL
  Filled 2016-05-13 (×2): qty 1

## 2016-05-13 MED ORDER — ATENOLOL-CHLORTHALIDONE 50-25 MG PO TABS: 1.0000 | ORAL_TABLET | Freq: Every day | ORAL | Status: DC

## 2016-05-13 NOTE — ED Notes (Signed)
Dana Delgado, [phone].  She stated "We have her a room @ [facility] in the morning.  Please have someone call me."

## 2016-05-13 NOTE — ED Notes (Signed)
Belongings in [place]

## 2016-05-13 NOTE — ED Provider Notes (Signed)
[facility] DEPT Provider Note   CSN: [id number] Arrival date & time: 05/13/16  1540     History   Chief Complaint No chief complaint on file.   HPI Dana Delgado is a 83 y.o. female.  The history is provided by the patient.  Mental Health Problem  Presenting symptoms: aggressive behavior ("tearing the house up") and suicidal threats (states she does not want to live with her sister-on-law and would rather die)   Patient accompanied by: sent to ED alone, sister-in-law reached by phone. Degree of incapacity (severity):  Moderate Onset quality:  Gradual Duration:  2 months Timing:  Constant Progression:  Worsening Chronicity:  Chronic Context: noncompliance (refusing to take medicines at home)   Relieved by:  Nothing Worsened by:  Nothing Ineffective treatments:  None tried   Past Medical History:  Diagnosis Date  . Alcoholism ([place])   . Anemia    Iron def  . Arrhythmia   . Arthritis   . Dementia with behavioral disturbance 04/15/2016  . Family hx of colon cancer    mother  . GERD (gastroesophageal reflux disease)   . Hypertension     Patient Active Problem List   Diagnosis Date Noted  . Dementia with behavioral disturbance 04/15/2016  . Personal history of colonic polyps 11/06/2011  . Abnormal weight loss 10/21/2011    Past Surgical History:  Procedure Laterality Date  . APPENDECTOMY    . CHOLECYSTECTOMY      OB History    No data available       Home Medications    Prior to Admission medications   Medication Sig Start Date End Date Taking? Authorizing Provider  atenolol-chlorthalidone (TENORETIC) 50-25 MG per tablet Take 1 tablet by mouth daily.    Historical Provider, MD  DULoxetine (CYMBALTA) 30 MG capsule Take 30 mg by mouth daily.    Historical Provider, MD  risperiDONE (RISPERDAL) 0.5 MG tablet Take 1 tablet (0.5 mg total) by mouth at bedtime. 04/15/16   Kathrynn Ducking, MD  traMADol-acetaminophen (ULTRACET) 37.5-325 MG per tablet Take 1  tablet by mouth every 6 (six) hours as needed for severe pain.     Historical Provider, MD    Family History Family History  Problem Relation Age of Onset  . Colon cancer Mother 81  . Lung cancer Father     Social History Social History  Substance Use Topics  . Smoking status: Never Smoker  . Smokeless tobacco: Never Used  . Alcohol use Yes     Comment: Drinks on weekends      Allergies   Codeine and Latex   Review of Systems Review of Systems  All other systems reviewed and are negative.    Physical Exam Updated Vital Signs BP 130/68 (BP Location: Right Arm)   Pulse 60   Temp 98 F (36.7 C) (Oral)   Resp 18   SpO2 99%   Physical Exam  Constitutional: She is oriented to person, place, and time. She appears well-developed and well-nourished. No distress.  Patient is eating, laughing, interactive and appropriate for her baseline and appears no different than her previous visit  HENT:  Head: Normocephalic.  Nose: Nose normal.  Eyes: Conjunctivae are normal.  Neck: Neck supple. No tracheal deviation present.  Cardiovascular: Normal rate and regular rhythm.   Pulmonary/Chest: Effort normal. No respiratory distress.  Abdominal: Soft. She exhibits no distension.  Neurological: She is alert and oriented to person, place, and time.  Skin: Skin is warm and dry.  Psychiatric: She has a normal mood and affect. Her behavior is normal.     ED Treatments / Results  Labs (all labs ordered are listed, but only abnormal results are displayed) Labs Reviewed - No data to display  EKG  EKG Interpretation None       Radiology No results found.  Procedures Procedures (including critical care time)  Medications Ordered in ED Medications - No data to display   Initial Impression / Assessment and Plan / ED Course  I have reviewed the triage vital signs and the nursing notes.  Pertinent labs & imaging results that were available during my care of the patient were  reviewed by me and considered in my medical decision making (see chart for details).  Clinical Course     83 y.o. female presents with threatening passive suicidal ideation to her caregivers at home. She has had increasing behavioral disturbance with progressive dementia and has been having what seem to be tantrums and refusing to take her medications. She has received outpatient workup and organic cause has been ruled out. Family members called an ambulance and sent the patient unaccompanied here because they "need an FL2 form done" to be able to place her in a facility who have supposedly accepted her.   I explained that we cannot place patients from the ED typically but family is concerned about the patient's suicidal threats. When confronted about this the patient states that she would rather die than live with her sister-in-law. This does not appear to be a significant threat and she has no plan or means to carry out a suicidal act. She shows no signs of depression relating to her dementia currently and has a good appetite and normal affect. She is taking medications here when presented and is sleeping comfortably. Social work consulted to help with potentially placing the patient at [facility]. PT/OT eval recommended for medicare status. No further psychiatric or medical workup is indicated given the patient's current clinical appearance.  Final Clinical Impressions(s) / ED Diagnoses   Final diagnoses:  Alzheimer's dementia with behavioral disturbance, unspecified timing of dementia onset  Social discord    New Prescriptions New Prescriptions   No medications on file     Leo Grosser, MD 05/14/16 0025

## 2016-05-14 NOTE — NC FL2 (Signed)
  [place] LEVEL OF CARE SCREENING TOOL     IDENTIFICATION  Patient Name: Dana Delgado Birthdate: May 28, 1930 Sex: female Admission Date (Current Location): 05/13/2016  [facility] and [place] Number:  Herbalist and Address:  [facility],  [place] [address] [place]      Provider Number: [id number]  Attending Physician Name and Address:  Provider Default, MD  Relative Name and Phone Number:       Current Level of Care: Hospital Recommended Level of Care: [place] Prior Approval Number:    Date Approved/Denied:   PASRR Number:   [id number]  Discharge Plan: SNF    Current Diagnoses: Patient Active Problem List   Diagnosis Date Noted  . Dementia with behavioral disturbance 04/15/2016  . Personal history of colonic polyps 11/06/2011  . Abnormal weight loss 10/21/2011    Orientation RESPIRATION BLADDER Height & Weight     Self, Time, Situation, Place  Normal Continent Weight:   Height:     BEHAVIORAL SYMPTOMS/MOOD NEUROLOGICAL BOWEL NUTRITION STATUS   (none )  (none ) Continent Diet (regular )  AMBULATORY STATUS COMMUNICATION OF NEEDS Skin   Limited Assist Verbally Normal                       Personal Care Assistance Level of Assistance  Dressing, Bathing, Feeding Bathing Assistance: Limited assistance Feeding assistance: Limited assistance Dressing Assistance: Limited assistance     Functional Limitations Info  Speech, Hearing, Sight Sight Info: Adequate Hearing Info: Adequate Speech Info: Adequate    SPECIAL CARE FACTORS FREQUENCY  PT (By licensed PT), OT (By licensed OT)     PT Frequency: 3 OT Frequency: 2            Contractures      Additional Factors Info  Code Status, Allergies Code Status Info: FULL CODE  Allergies Info:  Codeine, Latex           Current Medications (05/14/2016):  This is the current hospital active medication list Current Facility-Administered  Medications  Medication Dose Route Frequency Provider Last Rate Last Dose  . atenolol (TENORMIN) tablet 50 mg  50 mg Oral Daily Leo Grosser, MD       And  . chlorthalidone (HYGROTON) tablet 25 mg  25 mg Oral Daily Leo Grosser, MD   25 mg at 05/14/16 0100  . DULoxetine (CYMBALTA) DR capsule 30 mg  30 mg Oral Daily Leo Grosser, MD      . risperiDONE (RISPERDAL) tablet 0.5 mg  0.5 mg Oral QHS Leo Grosser, MD   0.5 mg at 05/14/16 0100   Current Outpatient Prescriptions  Medication Sig Dispense Refill  . atenolol-chlorthalidone (TENORETIC) 50-25 MG per tablet Take 1 tablet by mouth daily.    . DULoxetine (CYMBALTA) 30 MG capsule Take 30 mg by mouth daily.    . risperiDONE (RISPERDAL) 0.5 MG tablet Take 1 tablet (0.5 mg total) by mouth at bedtime. (Patient not taking: Reported on 05/13/2016) 30 tablet 2     Discharge Medications: Please see discharge summary for a list of discharge medications.  Relevant Imaging Results:  Relevant Lab Results:   Additional Information SSN [id number]  Rozell Searing

## 2016-05-14 NOTE — ED Notes (Signed)
Spoke with patient's daughter by telephone who said she has a bed at [facility].  Occupational health in with patient doing their assessment.  Social work notified of conversation with patient's daughter.

## 2016-05-14 NOTE — Clinical Social Work Note (Signed)
MSW spoke with patient's sister-in-law Judson Roch who reported that she has been in contact with admissions office at [facility]. Pt's sister further reported that once facility has additional information facility will be prepared to admit patient.   MSW has completed FL-2, PASARR and faxed PT/OT evaluations via [facility] for review. MSW has notified admissions office and awaiting a returned phone call after documents are reviewed.   No further concerns reported at this time.   Glendon Axe, MSW [phone] 05/14/2016 2:45 PM

## 2016-05-14 NOTE — Progress Notes (Signed)
CSW attempted to contact patients sister-in-law, Judson Roch, with no answer. CSW will call again.   [name], LCSWA Clinical Social Worker [phone]

## 2016-05-14 NOTE — Progress Notes (Signed)
CSW discussed pt with MD.  Pt will need PT/OT consults to determine her LOC.  It is unlikely that pt's [facility] Medicare will pay for SNF if pt does not truly have a skilled need.  CSW unable to discuss bed availability with [facility] because of time of day.  Will ask ED coverage to determine whether not pt/family intend to pay privately for her care (SNF vs ALF) if insurance will not. If family unable to provide her care, APS may need to be contacted for abandonment.  Creta Levin, LCSW Evening ED Coverage [id number]

## 2016-05-14 NOTE — ED Notes (Signed)
Pt stated "I don't want my sister-in-law here.  She don't know how to talk to anybody.  She came over to my house and she just yells.  I don't say nothing, they say I'm too quiet.  I don't want her around me.  She tries to be a Marine scientist, she know everything, she gives me too much medicine.  She gives me way too much, too quick.  My husband brought me here."  Pt ambulates with a cane.

## 2016-05-14 NOTE — Progress Notes (Signed)
CSW received call back from patients sister-in-law sarah. Patient is able to stay with her until [place] has insurance authorization. CSW will update RN and EDP. [facility] contacted for transportation.   [name], [place] Clinical Social Worker [phone]

## 2016-05-14 NOTE — ED Notes (Signed)
Patient discharged with [facility]. She stood and walked independently to the stretcher. She was dressed in her green sweater and wanted to keep on her scrub pants. Other clothing items sent with patient including 4 white rings, 4 shirts, and a pair of black shoes. Family has been notified of patients return home.

## 2016-05-14 NOTE — Evaluation (Signed)
Physical Therapy Evaluation Patient Details Name: Dana Delgado MRN: [id number] DOB: Jun 18, 1930 Today's Date: 05/14/2016   History of Present Illness  Pt was admitted for aggressive behavior and suicide thoughts.  PMH significant for dementia and ETOH  Clinical Impression  Pt admitted as above and presenting with functional mobility limitations 2* generalized weakness, ambulatory balance deficits, questionable safety awareness and dementia related cognitive deficits.  Pt would benefit from follow up at SNF level.    Follow Up Recommendations SNF    Equipment Recommendations       Recommendations for Other Services OT consult     Precautions / Restrictions Precautions Precautions: Fall Precaution Comments: monitor BP:  may be orthostatic Restrictions Weight Bearing Restrictions: No      Mobility  Bed Mobility               General bed mobility comments: pt sitting EOB  Transfers Overall transfer level: Needs assistance Equipment used: Rolling walker (2 wheeled);Straight cane Transfers: Sit to/from Stand Sit to Stand: Min assist         General transfer comment: cues for UE placement; assist to rise and steady  Ambulation/Gait Ambulation/Gait assistance: Min assist Ambulation Distance (Feet): 85 Feet Assistive device: Rolling walker (2 wheeled);Straight cane Gait Pattern/deviations: Step-through pattern;Decreased step length - right;Decreased step length - left;Shuffle;Trunk flexed Gait velocity: decr Gait velocity interpretation: Below normal speed for age/gender General Gait Details: Pt ambulated to bathroom with SPC and HHA for balance,  Pt ambulated additional 8' with RW, min guard assist and cues for posture and position from [employer]            Wheelchair Mobility    Modified Rankin (Stroke Patients Only)       Balance Overall balance assessment: Needs assistance Sitting-balance support: No upper extremity supported;Feet  supported Sitting balance-Leahy Scale: Good     Standing balance support: No upper extremity supported Standing balance-Leahy Scale: Poor                               Pertinent Vitals/Pain Pain Assessment: Faces Faces Pain Scale: No hurt    Home Living Family/patient expects to be discharged to:: Unsure                      Prior Function           Comments: unsure if she has help.  Used SPC     Hand Dominance        Extremity/Trunk Assessment   Upper Extremity Assessment Upper Extremity Assessment: Generalized weakness    Lower Extremity Assessment Lower Extremity Assessment: Generalized weakness    Cervical / Trunk Assessment Cervical / Trunk Assessment: Kyphotic  Communication   Communication: Expressive difficulties  Cognition Arousal/Alertness: Awake/alert Behavior During Therapy: WFL for tasks assessed/performed Overall Cognitive Status: No family/caregiver present to determine baseline cognitive functioning                      General Comments      Exercises     Assessment/Plan    PT Assessment Patient needs continued PT services  PT Problem List Decreased strength;Decreased activity tolerance;Decreased balance;Decreased mobility;Decreased cognition;Decreased knowledge of use of DME;Decreased safety awareness          PT Treatment Interventions DME instruction;Gait training;Functional mobility training;Therapeutic activities;Therapeutic exercise;Balance training;Cognitive remediation;Patient/family education    PT Goals (Current goals can be found in the Care  Plan section)  Acute Rehab PT Goals Patient Stated Goal: none stated PT Goal Formulation: Patient unable to participate in goal setting Time For Goal Achievement: 05/28/16 Potential to Achieve Goals: Fair    Frequency Min 3X/week   Barriers to discharge        Co-evaluation PT/OT/SLP Co-Evaluation/Treatment: Yes Reason for Co-Treatment: For  patient/therapist safety PT goals addressed during session: Mobility/safety with mobility OT goals addressed during session: ADL's and self-care       End of Session Equipment Utilized During Treatment: Gait belt Activity Tolerance: Patient tolerated treatment well Patient left: Other (comment) (sitting EOB)      Functional Assessment Tool Used: Clinical judgement Functional Limitation: Mobility: Walking and moving around Mobility: Walking and Moving Around Current Status [id number]): At least 20 percent but less than 40 percent impaired, limited or restricted Mobility: Walking and Moving Around Goal Status [id number]): At least 1 percent but less than 20 percent impaired, limited or restricted    Time: 0930-0958 PT Time Calculation (min) (ACUTE ONLY): 28 min   Charges:   PT Evaluation $PT Eval Moderate Complexity: 1 Procedure     PT G Codes:   PT G-Codes **NOT FOR INPATIENT CLASS** Functional Assessment Tool Used: Clinical judgement Functional Limitation: Mobility: Walking and moving around Mobility: Walking and Moving Around Current Status [id number]): At least 20 percent but less than 40 percent impaired, limited or restricted Mobility: Walking and Moving Around Goal Status [phone]): At least 1 percent but less than 20 percent impaired, limited or restricted    Kahli Fitzgerald 05/14/2016, 12:20 PM

## 2016-05-14 NOTE — ED Notes (Signed)
Patient refused medications saying,"I don't take any medications."  "I have to check with my husband."

## 2016-05-14 NOTE — Evaluation (Signed)
Occupational Therapy Evaluation Patient Details Name: Dana Delgado MRN: [id number] DOB: [birth year] Today's Date: 05/14/2016    History of Present Illness Pt was admitted for aggressive behavior and suicide thoughts.  PMH significant for dementia and ETOH   Clinical Impression   This 83 year old female was admitted for the above. She will benefit from continued OT to increase safety and independence with adls.  PLOF is unknown.  Pt currently needs min A and goals are for min guard level    Follow Up Recommendations  SNF    Equipment Recommendations  None recommended by OT (has high commode and shower chair per pt)    Recommendations for Other Services       Precautions / Restrictions Precautions Precautions: Fall Precaution Comments: monitor BP:  may be orthostatic Restrictions Weight Bearing Restrictions: No      Mobility Bed Mobility               General bed mobility comments: pt sitting EOB  Transfers Overall transfer level: Needs assistance Equipment used: Rolling walker (2 wheeled);Straight cane Transfers: Sit to/from Stand Sit to Stand: Min assist         General transfer comment: cues for UE placement; assist to rise and steady    Balance Overall balance assessment: Needs assistance   Sitting balance-Leahy Scale: Good       Standing balance-Leahy Scale: Poor                              ADL Overall ADL's : Needs assistance/impaired     Grooming: Wash/dry hands;Minimal assistance;Standing   Upper Body Bathing: Supervision/ safety;Sitting   Lower Body Bathing: Minimal assistance;Sit to/from stand   Upper Body Dressing : Supervision/safety;Sitting   Lower Body Dressing: Minimal assistance;Sit to/from stand   Toilet Transfer: Minimal assistance;Ambulation;BSC (cane)   Toileting- Clothing Manipulation and Hygiene: Minimal assistance;Bed level         General ADL Comments: pt needed cues to remove sock prior to  placing another on.  Min A for balance for adls and ambulating to bathroom.  Cues in room to find soap, grab bar etc.  Pt reports vision is good.  When pt first stood, she reported dizziness.  sitting BP 152/82. When she stood again 136/101     Vision     Perception     Praxis      Pertinent Vitals/Pain Pain Assessment: Faces Faces Pain Scale: No hurt     Hand Dominance     Extremity/Trunk Assessment Upper Extremity Assessment Upper Extremity Assessment: Generalized weakness           Communication Communication Communication: Expressive difficulties   Cognition Arousal/Alertness: Awake/alert Behavior During Therapy: WFL for tasks assessed/performed Overall Cognitive Status: No family/caregiver present to determine baseline cognitive functioning                     General Comments       Exercises       Shoulder Instructions      Home Living Family/patient expects to be discharged to:: Unsure                                        Prior Functioning/Environment          Comments: unsure if she has help.  Used SPC  OT Problem List: Decreased strength;Decreased activity tolerance;Impaired balance (sitting and/or standing);Decreased cognition;Decreased safety awareness   OT Treatment/Interventions: Self-care/ADL training;DME and/or AE instruction;Patient/family education;Balance training;Cognitive remediation/compensation;Therapeutic activities    OT Goals(Current goals can be found in the care plan section) Acute Rehab OT Goals Patient Stated Goal: none stated OT Goal Formulation: With patient (general terms) Time For Goal Achievement: 05/21/16 Potential to Achieve Goals: Good ADL Goals Pt Will Perform Grooming: with min guard assist;standing Pt Will Perform Lower Body Bathing: with min guard assist;sit to/from stand Pt Will Perform Lower Body Dressing: with min guard assist;sit to/from stand Pt Will Transfer to Toilet:  with min guard assist;bedside commode;ambulating Pt Will Perform Toileting - Clothing Manipulation and hygiene: with min guard assist;sit to/from stand  OT Frequency: Min 2X/week   Barriers to D/C:            Co-evaluation PT/OT/SLP Co-Evaluation/Treatment: Yes Reason for Co-Treatment: For patient/therapist safety PT goals addressed during session: Mobility/safety with mobility OT goals addressed during session: ADL's and self-care      End of Session    Activity Tolerance: Patient tolerated treatment well Patient left: in bed;with bed alarm set;with call bell/phone within reach (eob)   [id number] OT Time Calculation (min): 20 min Charges:  OT General Charges $OT Visit: 1 Procedure OT Evaluation $OT Eval Low Complexity: 1 Procedure G-Codes: OT G-codes **NOT FOR INPATIENT CLASS** Functional Assessment Tool Used: clinical observation and judgment Functional Limitation: Self care Self Care Current Status [id number]): At least 20 percent but less than 40 percent impaired, limited or restricted Self Care Goal Status [id number]): At least 1 percent but less than 20 percent impaired, limited or restricted  [facility] 05/14/2016, 10:38 AM  Lesle Chris, OTR/L [phone] 05/14/2016

## 2016-05-14 NOTE — Progress Notes (Signed)
CSW received call from [place] with Dana Delgado stating they have not yet received insurance authorization. CSW spoke with Surveyor, quantity who stated patient will need to DC home. CSW will update RN, EDP, and patient/patients family.   [name], LCSWA Clinical Social Worker [phone]

## 2016-05-14 NOTE — Clinical Social Work Note (Signed)
Patient was not admitted from SNF. Family planning to place patient in SNF long-term at [facility]. MSW contacted admissions office at [facility] who is already has information on patient. Family has applied for LT Medicaid.   MSW to complete FL-2, [facility]ARR and fax clinical info to [facility] for review.   Admissions office to contact patient's family in regards to financial obligations.   Per RN, patient had safety sitter which was discontinued this morning. Patient also refusing care.  MSW awaiting a returned phone call from facility. MSW remains available as needed.   Glendon Axe, MSW [phone] 05/14/2016 1:50 PM

## 2016-05-15 ENCOUNTER — Encounter (HOSPITAL_COMMUNITY): Payer: Self-pay | Admitting: Emergency Medicine

## 2016-05-15 ENCOUNTER — Emergency Department (HOSPITAL_COMMUNITY)
Admission: EM | Admit: 2016-05-15 | Discharge: 2016-05-15 | Disposition: A | Discharge status: Home / Self Care | Payer: Medicare HMO | Attending: Emergency Medicine | Admitting: Emergency Medicine

## 2016-05-15 ENCOUNTER — Emergency Department (HOSPITAL_COMMUNITY): Payer: Medicare HMO

## 2016-05-15 DIAGNOSIS — Y929 Unspecified place or not applicable: Secondary | ICD-10-CM | POA: Insufficient documentation

## 2016-05-15 DIAGNOSIS — I10 Essential (primary) hypertension: Secondary | ICD-10-CM | POA: Insufficient documentation

## 2016-05-15 DIAGNOSIS — W1839XA Other fall on same level, initial encounter: Secondary | ICD-10-CM | POA: Diagnosis not present

## 2016-05-15 DIAGNOSIS — Z043 Encounter for examination and observation following other accident: Secondary | ICD-10-CM | POA: Insufficient documentation

## 2016-05-15 DIAGNOSIS — Y999 Unspecified external cause status: Secondary | ICD-10-CM | POA: Diagnosis not present

## 2016-05-15 DIAGNOSIS — W19XXXA Unspecified fall, initial encounter: Secondary | ICD-10-CM

## 2016-05-15 DIAGNOSIS — Y939 Activity, unspecified: Secondary | ICD-10-CM | POA: Insufficient documentation

## 2016-05-15 DIAGNOSIS — R0602 Shortness of breath: Secondary | ICD-10-CM | POA: Diagnosis not present

## 2016-05-15 DIAGNOSIS — Z91040 Latex allergy status: Secondary | ICD-10-CM | POA: Diagnosis not present

## 2016-05-15 LAB — I-STAT CHEM 8, ED
BUN: 36 mg/dL — ABNORMAL HIGH (ref 6–20)
CHLORIDE: 103 mmol/L (ref 101–111)
CREATININE: 1.5 mg/dL — AB (ref 0.44–1.00)
Calcium, Ion: 0.86 mmol/L — CL (ref 1.15–1.40)
GLUCOSE: 98 mg/dL (ref 65–99)
HEMATOCRIT: 32 % — AB (ref 36.0–46.0)
HEMOGLOBIN: 10.9 g/dL — AB (ref 12.0–15.0)
POTASSIUM: 3.4 mmol/L — AB (ref 3.5–5.1)
Sodium: 135 mmol/L (ref 135–145)
TCO2: 22 mmol/L (ref 0–100)

## 2016-05-15 MED ORDER — SODIUM CHLORIDE 0.9 % IV BOLUS (SEPSIS): 1000.0000 mL | Freq: Once | INTRAVENOUS | Status: DC

## 2016-05-15 MED ORDER — DULOXETINE HCL 30 MG PO CPEP
30.0000 mg | ORAL_CAPSULE | Freq: Every day | ORAL | Status: DC
  Filled 2016-05-15: qty 1

## 2016-05-15 MED ORDER — SODIUM CHLORIDE 0.9 % IV SOLN: INTRAVENOUS | Status: DC

## 2016-05-15 MED ORDER — RISPERIDONE 0.5 MG PO TABS: 0.5000 mg | ORAL_TABLET | Freq: Every day | ORAL | Status: DC

## 2016-05-15 MED ORDER — ATENOLOL-CHLORTHALIDONE 50-25 MG PO TABS: 1.0000 | ORAL_TABLET | Freq: Every day | ORAL | Status: DC

## 2016-05-15 MED ORDER — CHLORTHALIDONE 25 MG PO TABS
25.0000 mg | ORAL_TABLET | Freq: Every day | ORAL | Status: DC
  Filled 2016-05-15: qty 1

## 2016-05-15 MED ORDER — LORAZEPAM 2 MG/ML IJ SOLN
1.0000 mg | Freq: Once | INTRAMUSCULAR | Status: AC
  Administered 2016-05-15: 1 mg via INTRAMUSCULAR
  Filled 2016-05-15: qty 1

## 2016-05-15 MED ORDER — ATENOLOL 50 MG PO TABS
50.0000 mg | ORAL_TABLET | Freq: Every day | ORAL | Status: DC
  Filled 2016-05-15: qty 1

## 2016-05-15 NOTE — ED Notes (Signed)
Patient states that she got to "shit". Patient looking for out house and got beside sink in room ans squatted.  Tech and this RN trying to assist patient into bathroom so patient wouldn't use bathroom in floor.  Patient becomes aggressive and swatting and scratching at staff.

## 2016-05-15 NOTE — ED Provider Notes (Signed)
[facility] DEPT Provider Note   CSN: [id number] Arrival date & time: 05/15/16  0844     History   Chief Complaint Chief Complaint  Patient presents with  . Fall    HPI Dana Delgado is a 83 y.o. female.  83 year old female presents via EMS after being found on the ground. She has a history of dementia and was discharged from this facility yesterday after psychiatric admission. Patient states that she does normally use a cane and walker occasionally. States she lost her balance today and fell to the floor. Denies any head or neck or back discomfort. Denies any hip discomfort. Patient did not want to come to the hospital but her daughter was concerned and EMS transported here. She denies any recent fever, vomiting, diarrhea, blood loss.      Past Medical History:  Diagnosis Date  . Alcoholism ([place])   . Anemia    Iron def  . Arrhythmia   . Arthritis   . Dementia with behavioral disturbance 04/15/2016  . Family hx of colon cancer    mother  . GERD (gastroesophageal reflux disease)   . Hypertension     Patient Active Problem List   Diagnosis Date Noted  . Dementia with behavioral disturbance 04/15/2016  . Personal history of colonic polyps 11/06/2011  . Abnormal weight loss 10/21/2011    Past Surgical History:  Procedure Laterality Date  . APPENDECTOMY    . CHOLECYSTECTOMY      OB History    No data available       Home Medications    Prior to Admission medications   Medication Sig Start Date End Date Taking? Authorizing Provider  risperiDONE (RISPERDAL) 0.5 MG tablet Take 1 tablet (0.5 mg total) by mouth at bedtime. 04/15/16  Yes Kathrynn Ducking, MD  atenolol-chlorthalidone (TENORETIC) 50-25 MG per tablet Take 1 tablet by mouth daily.    Historical Provider, MD  DULoxetine (CYMBALTA) 30 MG capsule Take 30 mg by mouth daily.    Historical Provider, MD    Family History Family History  Problem Relation Age of Onset  . Colon cancer Mother 21  . Lung  cancer Father     Social History Social History  Substance Use Topics  . Smoking status: Never Smoker  . Smokeless tobacco: Never Used  . Alcohol use Yes     Comment: Drinks on weekends      Allergies   Codeine and Latex   Review of Systems Review of Systems  All other systems reviewed and are negative.    Physical Exam Updated Vital Signs BP 132/80 (BP Location: Left Arm)   Pulse 76   Temp 97.6 F (36.4 C) (Oral)   Resp 16   SpO2 99%   Physical Exam  Constitutional: She is oriented to person, place, and time. She appears well-developed and well-nourished.  Non-toxic appearance. No distress.  HENT:  Head: Normocephalic and atraumatic.  Eyes: Conjunctivae, EOM and lids are normal. Pupils are equal, round, and reactive to light.  Neck: Normal range of motion. Neck supple. No tracheal deviation present. No thyroid mass present.  Cardiovascular: Normal rate, regular rhythm and normal heart sounds.  Exam reveals no gallop.   No murmur heard. Pulmonary/Chest: Effort normal and breath sounds normal. No stridor. No respiratory distress. She has no decreased breath sounds. She has no wheezes. She has no rhonchi. She has no rales.  Abdominal: Soft. Normal appearance and bowel sounds are normal. She exhibits no distension. There is no tenderness.  There is no rebound and no CVA tenderness.  Musculoskeletal: Normal range of motion. She exhibits no edema or tenderness.  Neurological: She is alert and oriented to person, place, and time. She has normal strength. No cranial nerve deficit or sensory deficit. GCS eye subscore is 4. GCS verbal subscore is 5. GCS motor subscore is 6.  Patient able to ambulate in the department without assistance  Skin: Skin is warm and dry. No abrasion and no rash noted.  Psychiatric: She has a normal mood and affect. Her speech is normal and behavior is normal.  Nursing note and vitals reviewed.    ED Treatments / Results  Labs (all labs ordered are  listed, but only abnormal results are displayed) Labs Reviewed  I-STAT CHEM 8, ED    EKG  EKG Interpretation None       Radiology No results found.  Procedures Procedures (including critical care time)  Medications Ordered in ED Medications - No data to display   Initial Impression / Assessment and Plan / ED Course  I have reviewed the triage vital signs and the nursing notes.  Pertinent labs & imaging results that were available during my care of the patient were reviewed by me and considered in my medical decision making (see chart for details).  Clinical Course     Patient not orthostatic here and her left lesser performed which does show some renal insufficiency. She has not taken diuretics. Patient was able to eat and drink appropriately and her creatinine will require follow-up. Attempted to send the patient home however the family stated that there was no heat in the house and that no one will be home. Have counseled social work for disposition Final Clinical Impressions(s) / ED Diagnoses   Final diagnoses:  None    New Prescriptions New Prescriptions   No medications on file     Lacretia Leigh, MD 05/15/16 1249

## 2016-05-15 NOTE — Progress Notes (Signed)
CSW spoke with Helene Kelp with [facility] who received insurance authorization. Patient is able to DC to facility today 1/5 if medically stable. CSW will update RN, EDP, and patients family.   [name], LCSWA Clinical Social Worker [phone]

## 2016-05-15 NOTE — ED Notes (Signed)
Bed: [id number] Expected date:  Expected time:  Means of arrival: Ambulance Comments: EMS 83 yo fall dementia

## 2016-05-15 NOTE — Progress Notes (Signed)
CSW called [facility] for transport.   [name], LCSWA Clinical Social Worker [phone]

## 2016-05-15 NOTE — ED Notes (Signed)
Patient ambulating out in to hallway. Patient having to be redirected with RN and security back in to room.

## 2016-05-15 NOTE — ED Notes (Signed)
Pt provided with meal tray with OJ and water per Dr Zenia Resides

## 2016-05-15 NOTE — Progress Notes (Signed)
CSW spoke to patient sister-in-law, Judson Roch, who states she is unable to care for patient. Judson Roch stated "i just cant do this any longer. I dont care if APS has to be involved. I just cant take care of her". CSW will make APS report.   [name], LCSWA Clinical Social Worker [phone]

## 2016-05-15 NOTE — ED Notes (Signed)
Pt was walking in the hall way and stumbled and fell.  Pt did not hit head.  Fell directly on her butt.  Pt denies pain.  Ambulatory as normal.  Will let MD know.

## 2016-05-15 NOTE — ED Notes (Signed)
Pt ambulated independently with Zenia Resides at bedside. Pt gait some unsteady. Pt verbalizes has cane and walker at home.

## 2016-05-15 NOTE — Progress Notes (Signed)
Patients sister-in-law, Felicie Boudwin, is also in hospital at this time.   [name], LCSWA Clinical Social Worker [phone]

## 2016-05-15 NOTE — ED Triage Notes (Signed)
Per [facility] pt unwitnessed fall while daughter was gone to [facility] for an hour. Pt found crawling on floor; denies pain but uncooperative when questioning. Pt alert to self; hx of dementia. Per daughter pt more uncooperative over past few weeks.

## 2016-05-15 NOTE — ED Notes (Signed)
Called pt family member Clarise Cruz and inquired about transport back to her home. Family member responded that she was going into the hospital and the spouse of pt is in hospital. Family member also advised that there is no heat in the home and pt cannot return there. Family member sts," I don't know what to tell you," Pt was advised that not making arrangements for family to be taken care of and leaving it to the responsibility of the hospital is considered abandonment and that SW would be consulted. Pt stated," that will work." and hung up the phone. Dr Zenia Resides and charge RN made aware of situation and consult for SW placed.

## 2016-05-24 ENCOUNTER — Emergency Department (HOSPITAL_COMMUNITY)
Admission: EM | Admit: 2016-05-24 | Discharge: 2016-05-25 | Disposition: A | Discharge status: Home / Self Care | Payer: Medicare HMO | Attending: Emergency Medicine | Admitting: Emergency Medicine

## 2016-05-24 ENCOUNTER — Emergency Department (HOSPITAL_COMMUNITY): Payer: Medicare HMO

## 2016-05-24 DIAGNOSIS — I10 Essential (primary) hypertension: Secondary | ICD-10-CM | POA: Diagnosis not present

## 2016-05-24 DIAGNOSIS — R05 Cough: Secondary | ICD-10-CM

## 2016-05-24 DIAGNOSIS — R569 Unspecified convulsions: Secondary | ICD-10-CM

## 2016-05-24 DIAGNOSIS — R059 Cough, unspecified: Secondary | ICD-10-CM

## 2016-05-24 DIAGNOSIS — R930 Abnormal findings on diagnostic imaging of skull and head, not elsewhere classified: Secondary | ICD-10-CM | POA: Diagnosis not present

## 2016-05-24 LAB — URINALYSIS, ROUTINE W REFLEX MICROSCOPIC
Bilirubin Urine: NEGATIVE
Glucose, UA: NEGATIVE mg/dL
Hgb urine dipstick: NEGATIVE
Ketones, ur: NEGATIVE mg/dL
Leukocytes, UA: NEGATIVE
Nitrite: NEGATIVE
Protein, ur: 30 mg/dL — AB
Specific Gravity, Urine: 1.016 (ref 1.005–1.030)
pH: 8 (ref 5.0–8.0)

## 2016-05-24 MED ORDER — RISPERIDONE 0.5 MG PO TABS
0.5000 mg | ORAL_TABLET | Freq: Once | ORAL | Status: AC
  Administered 2016-05-24: 0.5 mg via ORAL
  Filled 2016-05-24: qty 1

## 2016-05-24 NOTE — ED Notes (Addendum)
Pt aggressive, not letting staff collect vitals at this time. Pt states "I'm gonna kill you if you don't get up out of here." EDP made aware. Phleb attempting to redraw labs.

## 2016-05-24 NOTE — ED Notes (Signed)
Patient transported to CT 

## 2016-05-24 NOTE — ED Provider Notes (Signed)
[facility] DEPT Provider Note   CSN: [id number] Arrival date & time: 05/24/16  2135     History   Chief Complaint Chief Complaint  Patient presents with  . Seizures    HPI Dana Delgado is a 83 y.o. female.  HPI level V caveat due to dementia  83 year old female presents today with reported seizure. Patient has a history of dementia with behavioral disturbances. Per EMS patient had seizure a facility the last approximate 5-10 minutes. Upon their arrival she was not responsive to sternal rub and postictal. I spoke with nursing home staff report that they were not there when the patient had this episode, but it was reported to them that patient was having whole-body shaking shortly after eating dinner. They deny any preceding illness or abnormal behavior.   Patient's power of attorney  Judson Roch [phone]    Past Medical History:  Diagnosis Date  . Alcoholism ([place])   . Anemia    Iron def  . Arrhythmia   . Arthritis   . Dementia with behavioral disturbance 04/15/2016  . Family hx of colon cancer    mother  . GERD (gastroesophageal reflux disease)   . Hypertension     Patient Active Problem List   Diagnosis Date Noted  . Dementia with behavioral disturbance 04/15/2016  . Personal history of colonic polyps 11/06/2011  . Abnormal weight loss 10/21/2011    Past Surgical History:  Procedure Laterality Date  . APPENDECTOMY    . CHOLECYSTECTOMY      OB History    No data available       Home Medications    Prior to Admission medications   Medication Sig Start Date End Date Taking? Authorizing Provider  atenolol-chlorthalidone (TENORETIC) 50-25 MG per tablet Take 1 tablet by mouth daily.   Yes Historical Provider, MD  DULoxetine (CYMBALTA) 30 MG capsule Take 30 mg by mouth daily.   Yes Historical Provider, MD  risperiDONE (RISPERDAL) 0.5 MG tablet Take 1 tablet (0.5 mg total) by mouth at bedtime. 04/15/16  Yes Kathrynn Ducking, MD    Family History Family  History  Problem Relation Age of Onset  . Colon cancer Mother 64  . Lung cancer Father     Social History Social History  Substance Use Topics  . Smoking status: Never Smoker  . Smokeless tobacco: Never Used  . Alcohol use Yes     Comment: Drinks on weekends      Allergies   Codeine and Latex   Review of Systems Review of Systems  Unable to perform ROS: Dementia  All other systems reviewed and are negative.    Physical Exam Updated Vital Signs BP 123/59 (BP Location: Right Arm)   Pulse 69   Temp 98.2 F (36.8 C) (Oral)   Resp 14   SpO2 100%   Physical Exam  Constitutional: She appears well-developed and well-nourished.  HENT:  Head: Normocephalic and atraumatic.  Eyes: EOM are normal. Pupils are equal, round, and reactive to light.  Pulmonary/Chest: Effort normal and breath sounds normal. No respiratory distress. She has no wheezes. She has no rales. She exhibits no tenderness.  Abdominal: Soft. She exhibits no distension. There is no tenderness.  Neurological: She is alert. No cranial nerve deficit. Coordination normal.  Skin: Skin is warm.  Nursing note and vitals reviewed.    ED Treatments / Results  Labs (all labs ordered are listed, but only abnormal results are displayed) Labs Reviewed  URINALYSIS, ROUTINE W REFLEX MICROSCOPIC - Abnormal;  Notable for the following:       Result Value   Protein, ur 30 (*)    Bacteria, UA RARE (*)    Squamous Epithelial / LPF 0-5 (*)    All other components within normal limits  CBC WITH DIFFERENTIAL/PLATELET - Abnormal; Notable for the following:    RBC 3.82 (*)    Hemoglobin 11.1 (*)    HCT 34.1 (*)    All other components within normal limits  BASIC METABOLIC PANEL - Abnormal; Notable for the following:    Creatinine, Ser 1.19 (*)    GFR calc non Af Amer 41 (*)    GFR calc Af Amer 48 (*)    All other components within normal limits  I-STAT CHEM 8, ED - Abnormal; Notable for the following:    Creatinine, Ser  1.20 (*)    Calcium, Ion 1.10 (*)    Hemoglobin 10.9 (*)    HCT 32.0 (*)    All other components within normal limits  CBC WITH DIFFERENTIAL/PLATELET    EKG  EKG Interpretation None       Radiology Ct Head Wo Contrast  Result Date: 05/24/2016 CLINICAL DATA:  Seizure. EXAM: CT HEAD WITHOUT CONTRAST TECHNIQUE: Contiguous axial images were obtained from the base of the skull through the vertex without intravenous contrast. COMPARISON:  05/01/2016. FINDINGS: Brain: There is no evidence for acute hemorrhage, hydrocephalus, mass lesion, or abnormal extra-axial fluid collection. No definite CT evidence for acute infarction. Diffuse loss of parenchymal volume is consistent with atrophy. Patchy low attenuation in the deep hemispheric and periventricular white matter is nonspecific, but likely reflects chronic microvascular ischemic demyelination. Vascular: Atherosclerotic calcification is visualized in the carotid arteries. No dense MCA sign. Major dural sinuses are unremarkable. Skull: No evidence for fracture. No worrisome lytic or sclerotic lesion. Sinuses/Orbits: The visualized paranasal sinuses and mastoid air cells are clear. Visualized portions of the globes and intraorbital fat are unremarkable. Other: None. IMPRESSION: 1. No acute intracranial abnormality. 2. Atrophy with chronic small vessel white matter ischemic disease. Electronically Signed   By: Misty Stanley M.D.   On: 05/24/2016 22:55   Dg Chest Port 1 View  Result Date: 05/25/2016 CLINICAL DATA:  Cough and congestion tonight. EXAM: PORTABLE CHEST 1 VIEW COMPARISON:  05/01/2016 FINDINGS: Unchanged cardiomediastinal contours with mild cardiomegaly and thoracic aortic atherosclerosis. Unchanged elevation of right hemidiaphragm. No focal airspace disease. No pulmonary edema, pleural fluid or pneumothorax. Chronic change of both shoulders. IMPRESSION: No acute abnormality.  Stable cardiomegaly. Thoracic aortic atherosclerosis. Electronically  Signed   By: Jeb Levering M.D.   On: 05/25/2016 02:03    Procedures Procedures (including critical care time)  Medications Ordered in ED Medications  risperiDONE (RISPERDAL) tablet 0.5 mg (0.5 mg Oral Given 05/24/16 2357)     Initial Impression / Assessment and Plan / ED Course  I have reviewed the triage vital signs and the nursing notes.  Pertinent labs & imaging results that were available during my care of the patient were reviewed by me and considered in my medical decision making (see chart for details).  Clinical Course     83 year old female presents with reported seizures. Patient has no history of the same. She was alert and oriented per her baseline here in the ED. She became very agitated with staff, she refused blood draw and continued to threaten staff. I spoke with Judson Roch her power of attorney and discussed the situation with her as patient was refusing medicine, refusing laboratory analysis. Further interventions  cause more aggravation. She was concerned that patient had upper respiratory infection over the last week and would like her to be evaluated for pneumonia. I was able to have Judson Roch speak with the patient, she allowed blood draw. We discussed the patient's care, who both felt that holding the patient here in the hospital would only aggravate her symptoms, and they would like her to be disposed home to care facility upon initial screening. I spoke with attending physician Dr. Johnney Killian about this, that she agreed that hospital admission for further workup would likely cause significant distress in this otherwise well-appearing female. Patient's head CT showed no significant findings, auditory analysis is pending. I anticipate patient will be discharged back to care facility with outpatient neurology follow-up.  Patient's labs returned showing no significant abnormality. I discussed findings with patient's healthcare power of attorney who agreed that discharge home to  care facility would be most appropriate in this patient. Patient will be transferred back to care facility with outpatient neurology follow-up.  Final Clinical Impressions(s) / ED Diagnoses   Final diagnoses:  Seizure [facility])    New Prescriptions Discharge Medication List as of 05/25/2016  4:12 AM       Okey Regal, PA-C 05/25/16 [id number]    Charlesetta Shanks, MD 05/26/16 [phone]

## 2016-05-24 NOTE — ED Triage Notes (Signed)
Pt BIB EMS from [facility], where staff witnessed "grand mal" seizure that lasted approx 10 min. Pt postictal with EMS, responding with moans. Pt now verbal with staff. Pt baseline dementia disoriented x 4; no hx of seizures. Resp e/u; no injuries observed; nad noted at this time.

## 2016-05-25 ENCOUNTER — Emergency Department (HOSPITAL_COMMUNITY): Payer: Medicare HMO

## 2016-05-25 LAB — BASIC METABOLIC PANEL WITH GFR
Anion gap: 12 (ref 5–15)
BUN: 19 mg/dL (ref 6–20)
CO2: 22 mmol/L (ref 22–32)
Calcium: 9.7 mg/dL (ref 8.9–10.3)
Chloride: 105 mmol/L (ref 101–111)
Creatinine, Ser: 1.19 mg/dL — ABNORMAL HIGH (ref 0.44–1.00)
GFR calc Af Amer: 48 mL/min — ABNORMAL LOW
GFR calc non Af Amer: 41 mL/min — ABNORMAL LOW
Glucose, Bld: 76 mg/dL (ref 65–99)
Potassium: 5.1 mmol/L (ref 3.5–5.1)
Sodium: 139 mmol/L (ref 135–145)

## 2016-05-25 LAB — CBC WITH DIFFERENTIAL/PLATELET
Basophils Absolute: 0 K/uL (ref 0.0–0.1)
Basophils Relative: 0 %
Eosinophils Absolute: 0.2 K/uL (ref 0.0–0.7)
Eosinophils Relative: 2 %
HCT: 34.1 % — ABNORMAL LOW (ref 36.0–46.0)
Hemoglobin: 11.1 g/dL — ABNORMAL LOW (ref 12.0–15.0)
Lymphocytes Relative: 52 %
Lymphs Abs: 3.9 K/uL (ref 0.7–4.0)
MCH: 29.1 pg (ref 26.0–34.0)
MCHC: 32.6 g/dL (ref 30.0–36.0)
MCV: 89.3 fL (ref 78.0–100.0)
Monocytes Absolute: 0.8 K/uL (ref 0.1–1.0)
Monocytes Relative: 11 %
Neutro Abs: 2.6 K/uL (ref 1.7–7.7)
Neutrophils Relative %: 35 %
Platelets: 286 K/uL (ref 150–400)
RBC: 3.82 MIL/uL — ABNORMAL LOW (ref 3.87–5.11)
RDW: 12.9 % (ref 11.5–15.5)
WBC: 7.5 K/uL (ref 4.0–10.5)

## 2016-05-25 LAB — I-STAT CHEM 8, ED
BUN: 20 mg/dL (ref 6–20)
Calcium, Ion: 1.1 mmol/L — ABNORMAL LOW (ref 1.15–1.40)
Chloride: 104 mmol/L (ref 101–111)
Creatinine, Ser: 1.2 mg/dL — ABNORMAL HIGH (ref 0.44–1.00)
Glucose, Bld: 83 mg/dL (ref 65–99)
HCT: 32 % — ABNORMAL LOW (ref 36.0–46.0)
Hemoglobin: 10.9 g/dL — ABNORMAL LOW (ref 12.0–15.0)
Potassium: 4 mmol/L (ref 3.5–5.1)
Sodium: 140 mmol/L (ref 135–145)
TCO2: 27 mmol/L (ref 0–100)

## 2016-05-25 NOTE — ED Notes (Signed)
Pt given her PM med; will attempt to draw labs in 1 hour.

## 2016-05-25 NOTE — ED Notes (Signed)
Pt refused blood draw,   Notified nurse. 

## 2016-05-25 NOTE — ED Notes (Signed)
Lab called and asked RN to redraw labs due to "erroneous" error. Will attempt at this time.

## 2016-05-25 NOTE — ED Notes (Signed)
Pt redirected and agreeing to get labs drawn.

## 2016-05-25 NOTE — ED Notes (Addendum)
Lab called to let RN know there wasn't "enough blood to run CMP." EDP made aware.

## 2016-05-25 NOTE — Discharge Instructions (Signed)
Please read attached information. If you experience any new or worsening signs or symptoms please return to the emergency room for evaluation. Please follow-up with your primary care provider or specialist as discussed.  °

## 2016-05-25 NOTE — ED Notes (Signed)
Pt refusing vital signs, and blood draws

## 2016-05-25 NOTE — ED Notes (Signed)
[facility] called for PT transport 

## 2016-07-14 ENCOUNTER — Ambulatory Visit: Payer: Medicare Other | Admitting: Adult Health

## 2017-03-11 DEATH — deceased

## 2017-11-20 IMAGING — CR DG CHEST 1V PORT
1 series · 1 of 1 positions shown · non-contrast
Comparison: 05/01/2016

CLINICAL DATA: Cough and congestion tonight.

EXAM:
PORTABLE CHEST 1 VIEW

[AP]
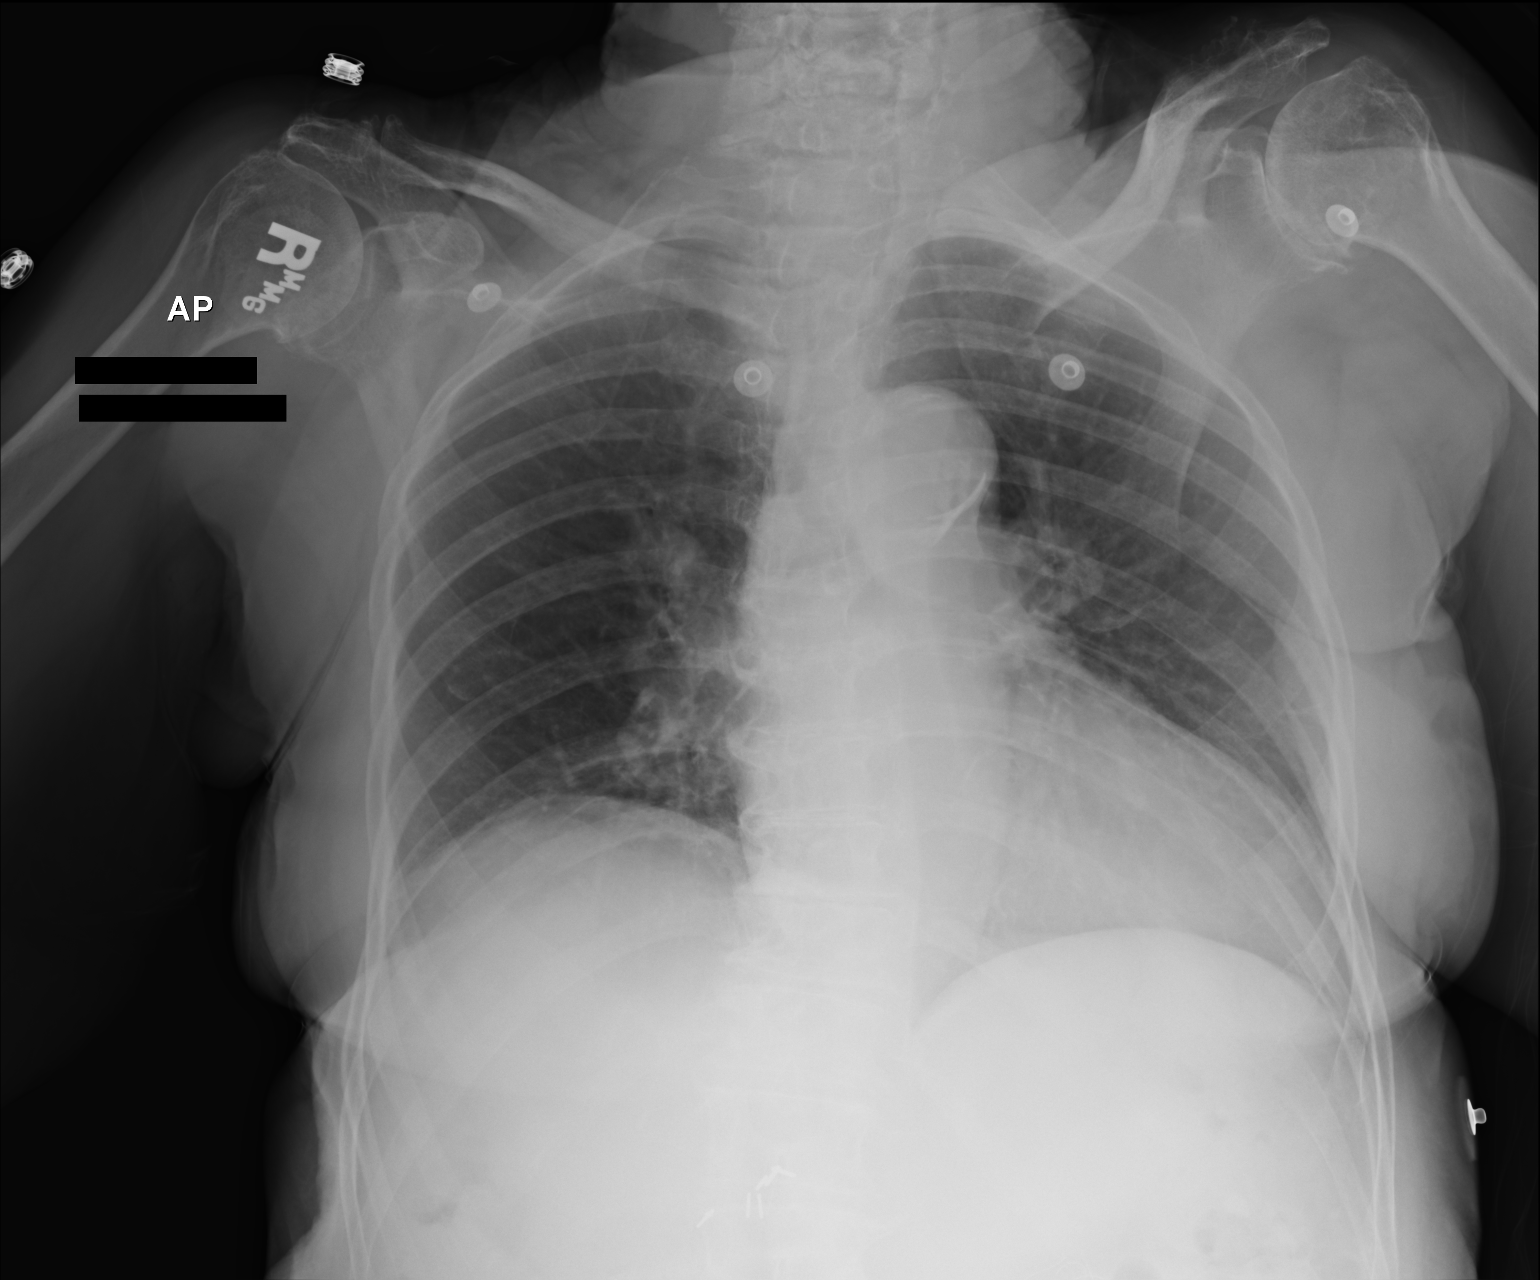

[1 of 1 positions shown; findings below may reference images not displayed]

FINDINGS: Unchanged cardiomediastinal contours with mild cardiomegaly and
thoracic aortic atherosclerosis. Unchanged elevation of right
hemidiaphragm. No focal airspace disease. No pulmonary edema,
pleural fluid or pneumothorax. Chronic change of both shoulders.
IMPRESSION: No acute abnormality.  Stable cardiomegaly.

Thoracic aortic atherosclerosis.
